# Patient Record
Sex: Female | Born: 1965 | Race: Black or African American | Hispanic: No | Smoking: Never smoker
Health system: Southern US, Community
[De-identification: ages and names within clinical notes are randomized; demographics above are authoritative.]

## PROBLEM LIST (undated history)

## (undated) ENCOUNTER — Emergency Department (HOSPITAL_COMMUNITY): Admission: EM | Discharge status: Against Medical Advice | Payer: PRIVATE HEALTH INSURANCE

## (undated) DIAGNOSIS — F319 Bipolar disorder, unspecified: Secondary | ICD-10-CM

## (undated) DIAGNOSIS — S72009A Fracture of unspecified part of neck of unspecified femur, initial encounter for closed fracture: Secondary | ICD-10-CM

## (undated) DIAGNOSIS — T7840XA Allergy, unspecified, initial encounter: Secondary | ICD-10-CM

## (undated) DIAGNOSIS — M1990 Unspecified osteoarthritis, unspecified site: Secondary | ICD-10-CM

## (undated) HISTORY — DX: Bipolar disorder, unspecified: F31.9

## (undated) HISTORY — DX: Allergy, unspecified, initial encounter: T78.40XA

## (undated) HISTORY — PX: KNEE ARTHROSCOPY: SUR90

## (undated) HISTORY — DX: Unspecified osteoarthritis, unspecified site: M19.90

## (undated) NOTE — *Deleted (*Deleted)
[facility]    CSN: [id number] Arrival date & time: 03/01/20  1618      History   Chief Complaint Chief Complaint  Patient presents with  . Finger Injury    HPI Rhonda [name] is a 65 y.o. female.   HPI  Past Medical History:  Diagnosis Date  . Allergy   . Arthritis   . Bipolar disorder (HCC)    Dr. Noelle Penner in [facility]  . Diabetes mellitus   . Hip fracture (HCC)    left    There are no problems to display for this patient.   Past Surgical History:  Procedure Laterality Date  . KNEE ARTHROSCOPY Right     OB History   No obstetric history on file.      Home Medications    Prior to Admission medications   Medication Sig Start Date End Date Taking? Authorizing Provider  aspirin 325 MG tablet Take 325 mg by mouth daily as needed for mild pain.    [provider]  cephALEXin (KEFLEX) 500 MG capsule Take 1 capsule (500 mg total) by mouth 3 (three) times daily. 03/18/17   Sherren Mocha, MD  metroNIDAZOLE (FLAGYL) 500 MG tablet Take 1 tablet (500 mg total) by mouth 2 (two) times daily. 03/16/17   Sherren Mocha, MD  Multiple Vitamins-Minerals (CENTRUM) tablet Take 1 tablet by mouth daily.      [provider]    Family History Family History  Problem Relation Age of Onset  . Cancer Mother   . Diabetes Mother   . Diabetes Father   . Hyperlipidemia Brother   . Hypertension Brother     Social History Social History   Tobacco Use  . Smoking status: Former Games developer  . Smokeless tobacco: Never Used  Vaping Use  . Vaping Use: Never used  Substance Use Topics  . Alcohol use: No  . Drug use: No     Allergies   Penicillins and Merthiolate [benzalkonium chloride]   Review of Systems Review of Systems   Physical Exam Triage Vital Signs ED Triage Vitals  Enc Vitals Group     BP 03/01/20 1750 (!) 151/87     Pulse Rate 03/01/20 1750 95     Resp 03/01/20 1750 17     Temp 03/01/20 1750 98.4 F (36.9 C)     Temp src --      SpO2  03/01/20 1750 100 %     Weight --      Height --      Head Circumference --      Peak Flow --      Pain Score 03/01/20 1747 8     Pain Loc --      Pain Edu? --      Excl. in GC? --    No data found.  Updated Vital Signs BP (!) 151/87 (BP Location: Left Arm)   Pulse 95   Temp 98.4 F (36.9 C)   Resp 17   SpO2 100%   Visual Acuity Right Eye Distance:   Left Eye Distance:   Bilateral Distance:    Right Eye Near:   Left Eye Near:    Bilateral Near:     Physical Exam   UC Treatments / Results  Labs (all labs ordered are listed, but only abnormal results are displayed) Labs Reviewed - No data to display  EKG   Radiology No results found.  Procedures Procedures (including critical care time)  Medications Ordered in  UC Medications - No data to display  Initial Impression / Assessment and Plan / UC Course  I have reviewed the triage vital signs and the nursing notes.  Pertinent labs & imaging results that were available during my care of the patient were reviewed by me and considered in my medical decision making (see chart for details).     *** Final Clinical Impressions(s) / UC Diagnoses   Final diagnoses:  None   Discharge Instructions   None    ED Prescriptions    None     PDMP not reviewed this encounter.

---

## 1998-06-02 ENCOUNTER — Inpatient Hospital Stay (HOSPITAL_COMMUNITY): Admission: AD | Admit: 1998-06-02 | Discharge: 1998-06-06 | Payer: Self-pay | Admitting: Family Medicine

## 1998-07-15 ENCOUNTER — Other Ambulatory Visit: Admission: RE | Admit: 1998-07-15 | Discharge: 1998-07-15 | Payer: Self-pay | Admitting: Obstetrics and Gynecology

## 1998-07-28 ENCOUNTER — Encounter: Admission: RE | Admit: 1998-07-28 | Discharge: 1998-10-26 | Payer: Self-pay | Admitting: Family Medicine

## 2002-05-08 ENCOUNTER — Encounter: Payer: Self-pay | Admitting: Otolaryngology

## 2002-05-08 ENCOUNTER — Encounter: Admission: RE | Admit: 2002-05-08 | Discharge: 2002-05-08 | Payer: Self-pay | Admitting: Otolaryngology

## 2002-05-13 ENCOUNTER — Other Ambulatory Visit: Admission: RE | Admit: 2002-05-13 | Discharge: 2002-05-13 | Payer: Self-pay | Admitting: Obstetrics and Gynecology

## 2002-05-22 ENCOUNTER — Encounter: Payer: Self-pay | Admitting: Obstetrics and Gynecology

## 2002-05-22 ENCOUNTER — Ambulatory Visit (HOSPITAL_COMMUNITY): Admission: RE | Admit: 2002-05-22 | Discharge: 2002-05-22 | Payer: Self-pay | Admitting: Obstetrics and Gynecology

## 2002-05-26 ENCOUNTER — Encounter: Payer: Self-pay | Admitting: Obstetrics and Gynecology

## 2002-05-26 ENCOUNTER — Encounter: Admission: RE | Admit: 2002-05-26 | Discharge: 2002-05-26 | Payer: Self-pay | Admitting: Obstetrics and Gynecology

## 2002-05-27 ENCOUNTER — Encounter: Admission: RE | Admit: 2002-05-27 | Discharge: 2002-05-27 | Payer: Self-pay | Admitting: Obstetrics and Gynecology

## 2002-05-27 ENCOUNTER — Encounter: Payer: Self-pay | Admitting: Obstetrics and Gynecology

## 2002-05-27 ENCOUNTER — Encounter (INDEPENDENT_AMBULATORY_CARE_PROVIDER_SITE_OTHER): Payer: Self-pay | Admitting: Specialist

## 2003-04-07 ENCOUNTER — Encounter: Admission: RE | Admit: 2003-04-07 | Discharge: 2003-04-07 | Payer: Self-pay | Admitting: Obstetrics and Gynecology

## 2004-04-03 ENCOUNTER — Inpatient Hospital Stay (HOSPITAL_COMMUNITY): Admission: EM | Admit: 2004-04-03 | Discharge: 2004-04-10 | Payer: Self-pay | Admitting: Emergency Medicine

## 2004-08-08 ENCOUNTER — Encounter: Admission: RE | Admit: 2004-08-08 | Discharge: 2004-08-08 | Payer: Self-pay | Admitting: Obstetrics and Gynecology

## 2005-09-10 ENCOUNTER — Encounter: Admission: RE | Admit: 2005-09-10 | Discharge: 2005-09-10 | Payer: Self-pay | Admitting: Obstetrics and Gynecology

## 2006-03-10 ENCOUNTER — Ambulatory Visit: Payer: Self-pay | Admitting: *Deleted

## 2006-03-11 ENCOUNTER — Inpatient Hospital Stay (HOSPITAL_COMMUNITY): Admission: EM | Admit: 2006-03-11 | Discharge: 2006-03-13 | Payer: Self-pay | Admitting: *Deleted

## 2006-04-18 IMAGING — CR DG CHEST 1V PORT
1 series · 1 of 1 positions shown · non-contrast
Comparison: none

CLINICAL DATA: 38 year old with TKA.  Central line placement. 
 PORTABLE CHEST:
 The patient had a right internal jugular central venous catheter with tip to the level of the superior vena cava. There is no evidence for post procedure pneumothorax. The lungs are clear.  Heart size is normal.

[view not recorded]
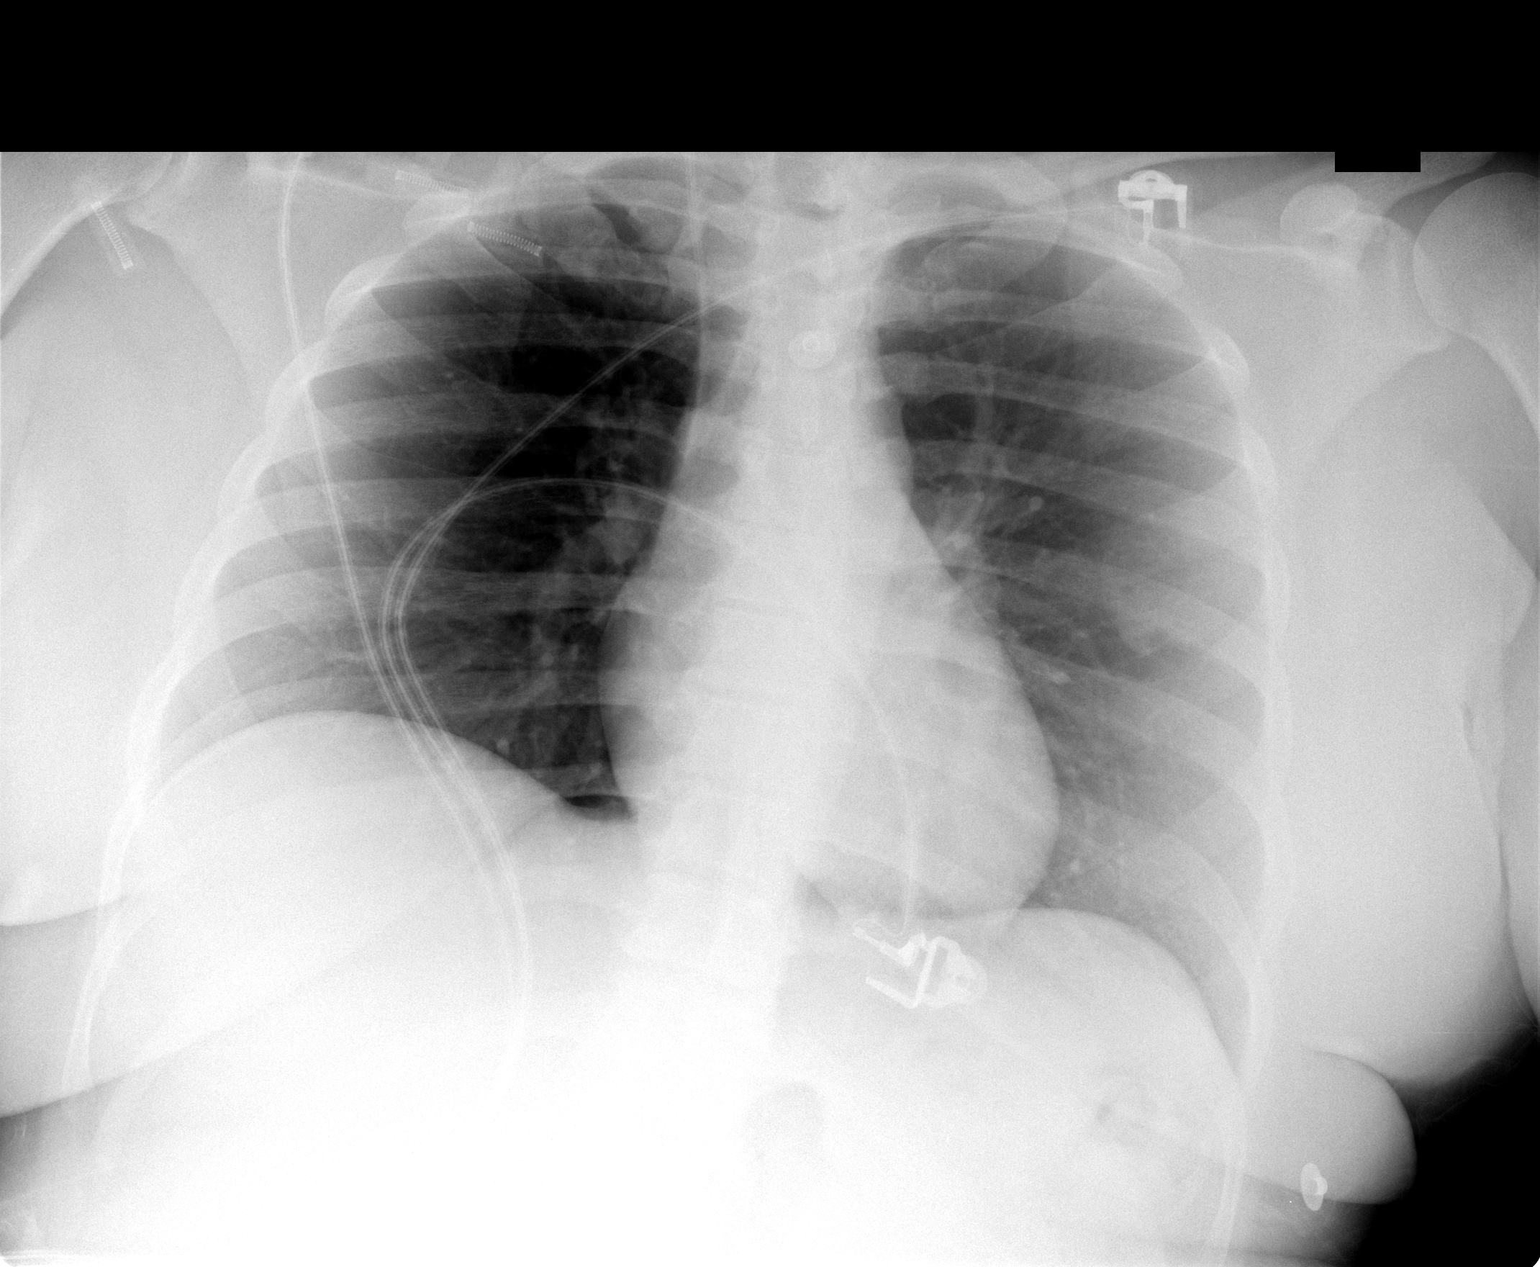

[1 of 1 positions shown; findings below may reference images not displayed]

IMPRESSION: Interval placement of right internal jugular central venous catheter without apparent complications.

## 2006-10-07 ENCOUNTER — Encounter: Admission: RE | Admit: 2006-10-07 | Discharge: 2006-10-07 | Payer: Self-pay | Admitting: Obstetrics and Gynecology

## 2007-10-09 ENCOUNTER — Encounter: Admission: RE | Admit: 2007-10-09 | Discharge: 2007-10-09 | Payer: Self-pay | Admitting: Obstetrics and Gynecology

## 2008-09-05 ENCOUNTER — Emergency Department (HOSPITAL_COMMUNITY): Admission: EM | Admit: 2008-09-05 | Discharge: 2008-09-06 | Payer: Self-pay | Admitting: *Deleted

## 2008-09-14 ENCOUNTER — Emergency Department (HOSPITAL_COMMUNITY): Admission: EM | Admit: 2008-09-14 | Discharge: 2008-09-14 | Payer: Self-pay | Admitting: Emergency Medicine

## 2008-09-15 ENCOUNTER — Emergency Department (HOSPITAL_COMMUNITY): Admission: EM | Admit: 2008-09-15 | Discharge: 2008-09-15 | Payer: Self-pay | Admitting: Emergency Medicine

## 2008-10-18 ENCOUNTER — Encounter: Admission: RE | Admit: 2008-10-18 | Discharge: 2008-10-18 | Payer: Self-pay | Admitting: Obstetrics and Gynecology

## 2010-08-01 ENCOUNTER — Other Ambulatory Visit: Payer: Self-pay | Admitting: Obstetrics and Gynecology

## 2010-08-01 DIAGNOSIS — Z1231 Encounter for screening mammogram for malignant neoplasm of breast: Secondary | ICD-10-CM

## 2010-08-09 ENCOUNTER — Ambulatory Visit
Admission: RE | Admit: 2010-08-09 | Discharge: 2010-08-09 | Disposition: A | Discharge status: Home / Self Care | Payer: PRIVATE HEALTH INSURANCE | Source: Ambulatory Visit | Attending: Obstetrics and Gynecology | Admitting: Obstetrics and Gynecology

## 2010-08-09 DIAGNOSIS — Z1231 Encounter for screening mammogram for malignant neoplasm of breast: Secondary | ICD-10-CM

## 2010-08-18 NOTE — Discharge Summary (Signed)
NAMEPERSIS, Rhonda [name] NO.:  [id number]   MEDICAL RECORD NO.:  [id number]          PATIENT TYPE:  IPS   LOCATION:  0402                          [facility]ACILITY[facility]   PHYSICIAN:  Jasmine Pang, M.D. DATE OF BIRTH:  05-22-1965   DATE OF ADMISSION:  03/11/2006  DATE OF DISCHARGE:  03/13/2006                               DISCHARGE SUMMARY   IDENTIFYING INFORMATION:  This is a 45 year old single African-American  female who was admitted on a voluntary basis on March 10, 2006.   HISTORY OF PRESENT ILLNESS:  Patient has a history of a petition against  her. The paper states that she has been endorsing homicidal ideation and  to hurt others.  She denies suicidal ideation.  She is off medications  for the past 2 months, Abilify, states it makes her speech slurred. She  is beginning to not feel well, having bad thoughts and went to see her  psychiatrist, Dr. Maryelizabeth Kaufmann. This is the first [facility]  admission for this patient. She sees Dr. Estelle June for outpatient  treatment.  She had a suicide attempt by overdose in 1996. In the past,  she has been on Risperdal, Depakote, Stelazine, Prolixin, and Zyprexa.  The patient's psychiatrist, Dr. Maryelizabeth Kaufmann, states she is only compliant for  several months at a time and then stops her medications.  Dr. Maryelizabeth Kaufmann  feels that once she gets back on her medications, she probably gets back  to baseline pretty quickly. The patient is on Abilify 10 mg p.o. nightly  but has been off for the past 2 months.  For further admission  information, please see psychiatric admission assessment.   The patient reported that there were angels around her who were talking  to her. She states she was able to communicate with them and found  herself talking out loud to them at work.  She states she feels that she  is [address], although she says there are times when she knows this  is not true.  She says at other times it is as if the angels could  talk  through her.  She says she has seen pictures on CD sleeves move and  talk for a while.  She states that she has found herself irritable and  a little paranoid about her mother. She had thoughts of making her  mother die by hitting her with a baseball bat but denies that she would  do this.   PHYSICAL EXAMINATION:  The patient was assessed at [facility]  There were no acute medical problems noted.  She has a history of  diabetes. CBC was within normal limits.  Glucose was 330.  Urine drug  screen positive. Urine pregnancy test negative.   HOSPITAL COURSE:  Upon admission, the patient was continued on her  outpatient medications of metformin 500 mg p.o. b.i.d. and Byetta 10 mg  subcutaneously b.i.d. and Abilify 10 mg p.o. nightly. On March 13, 2006, the patient's Byetta was discontinued.  She was placed on sliding  scale insulin.  She was started on Abilify 10 mg p.o. at 8 p.m.  and  Ambien 10 mg p.o. nightly p.r.n. insomnia.  She was started on Risperdal  0.5 mg p.o. q.6 h p.r.n.. agitation.  On March 12, 2006, the patient  was given 8 units of NPH subcutaneously at 1700 daily.  The patient  tolerated medications well.  The patient was placed on a glycemic  control protocol as per unit protocol, and the patient tolerated her  medications well with no significant side effects.Marland Kitchen   Upon admission, the patient stated she was from [facility] but was  seeing a psychiatrist in [place]. She stated she told her doctor she felt  irritated and had gotten off Abilify for the past 2 months.  She was  thinking of hurting her mother who gets on her nerves.  She stated she  has angels around her and that she was [address] according to [place].  She admitted saying this.  She admits to thinking of hitting people with  a bat because she is angry.  She had no specific person identified. On  March 12, 2006, the patient stated she talked with her mother  yesterday .  Her mother was  somewhat supportive.  She wants [place] to call  her doctor in [place]. I advised we were trying to reach her.  The  patient slept well.  Appetite was good.  She was agreeing to restart  Abilify 10 mg daily. In talking with Dr. Maryelizabeth Kaufmann, she felt that Rhonda [name]  gets to baseline easily and that as long as she was taking her  medications and not threatening to hurt herself or anyone else, that she  was probably capable of being discharged.   On March 13, 2006,  the patient's mental status had improved  markedly.  She was friendly and cooperative.  She had good eye contact.  Speech was normal rate and flow.  Psychomotor activity was within normal  limits.  Her mood was less depressed and irritable and anxious, affect  wider range.  There was no suicidal ideation.  No homicidal ideation.  No thoughts of self injurious behavior.  No auditory or visual  hallucinations.  No paranoia or delusions.  Thoughts were logical and  goal directed.  Thought content: No predominant theme. Cognitive was  grossly within normal limits.  It was felt the patient was safe to be  discharged today.  She will be transported back to [facility] to pick up her  car in the [facility] parking lot by the [facility]  Department.   DISCHARGE DIAGNOSES:  AXIS I: Mood disorder not otherwise specified  (rule out schizoaffective disorder).  AXIS II: None  AXIS III: Type 2 diabetes mellitus.  AXIS IV: Moderate (housing problem, medical problems other psychosocial  problem.  AXIS V: GAF upon upon discharge was 40.  GAF upon admission was 20.  GAF  highest past year was 60-65.   DISCHARGE PLANS:  There were no specific activity level or dietary  restrictions.   DISCHARGE MEDICATIONS:  1. Metformin 500 mg p.o. b.i.d. with food.  2. Abilify 10 mg at 8:00 p.m.   POST HOSPITAL CARE PLANS:  Dr. Maryelizabeth Kaufmann for followup treatment. She will  see her Saturday, April 06, 2006, at 9:30 a.m.      Jasmine Pang,  M.D. Electronically Signed     BHS/MEDQ  D:  03/13/2006  T:  03/13/2006  Job:  [id number]

## 2010-08-18 NOTE — Op Note (Signed)
[name], [name]            ACCOUNT NO.:  [id number]   MEDICAL RECORD NO.:  [id number]          PATIENT TYPE:  INP   LOCATION:  3306                         FACILITY:  [facility]   PHYSICIAN:  Sharlet Salina T. Hoxworth, M.D.DATE OF BIRTH:  10-24-1965   DATE OF PROCEDURE:  04/05/2003  DATE OF DISCHARGE:                                 OPERATIVE REPORT   PROCEDURE:  Placement of central venous catheter.   DESCRIPTION:  I was asked to obtain central venous access on this patient  due to a lack of peripheral access and admission with diabetic ketoacidosis.  The procedure was explained to the patient and family and informed consent  obtained.  The right neck was then sterilely prepped and draped.  The  anterior neck was anesthetized with local anesthesia and then the right  internal jugular vein cannulated with a needle and guide-wire without  difficulty.  The dilator was passed over the guide-wire and then the triple  lumen catheter passed over the guide-wire which was removed intact and the  catheter positioned at about 16 cm from the skin.  It aspirated easily and  was flushed with saline.  It was sutured into place with interrupted silks.  Chest x-ray pending.      Benj   BTH/MEDQ  D:  04/04/2004  T:  04/04/2004  Job:  [id number]

## 2010-08-18 NOTE — Discharge Summary (Signed)
Rhonda [name], KREITZ NO.:  [id number]   MEDICAL RECORD NO.:  [id number]          PATIENT TYPE:  INP   LOCATION:  6711                         FACILITY:  [facility]   PHYSICIAN:  Lorelle Formosa, M.D.DATE OF BIRTH:  [birth year]   DATE OF ADMISSION:  04/03/2004  DATE OF DISCHARGE:  04/10/2004                                 DISCHARGE SUMMARY   ADMITTING DIAGNOSES:  1.  Diabetic ketoacidosis.  2.  Bipolar disorder.  3.  History of hypertension.   DISCHARGE DIAGNOSES:  1.  Insulin-dependent diabetes mellitus with diabetic ketoacidosis.  2.  Obesity.  3.  Bipolar disorder.  4.  Headaches.   CONDITION ON DISCHARGE:  Stable.   DISCHARGE DISPOSITION:  Follow up in the office in approximately two weeks.   DISCHARGE MEDICATIONS:  Lantus insulin 50 units b.i.d.   HISTORY:  This patient is a 45 year old separated black woman who is a  resident of the city and presented with ketoacidosis of diabetes.  Patient  apparently called 911 day of admission ___________ back pain.  __________  apparently suggested by a family member.  Patient apparently had some  ___________ December.  Stated she was quite thirsty and drank mostly  carbonated beverages and tea.  Last fluid intake was about two days prior.  She admitted having some significant nausea/vomiting.  Patient denied use of  any other medicines, although her history suggested she has bipolar  disorder.   PERSONAL HISTORY:  __________.   REVIEW OF SYSTEMS:  On admission history and physical.   FAMILY HISTORY:  On admission history and physical.   PAST MEDICAL HISTORY:  On admission history and physical.   PHYSICAL EXAMINATION:  VITAL SIGNS:  Blood pressure 112/76, pulse 140,  respirations 26, pulse ox 100% on room air.  She was afebrile with  temperature 97.1 rectally.  GENERAL:  Patient was alert and oriented when stimulated.  She was lethargic  ___________ moderate sensorium change when not stimulated.  Patient  had  symmetrical movements of her upper extremities and lower extremities.  HEENT:  PERRLA.  EOMs were intact.  Mouth examination revealed moist mucous  membranes.  NECK:  Supple.  No jugular venous distention, bruits, __________  thyromegaly.  CHEST:  Clear to auscultation.  BREASTS:  Normal.  HEART:  Regular rhythm and rate with mild tachycardia.  No murmur.  ABDOMEN:  Soft, flat.  Bowel sounds were active.  EXTREMITIES:  No edema.  SKIN:  Slightly dry.  NEUROLOGIC:  __________.   LABORATORIES:  Arterial blood gas revealed pH of 7.071, pCO2 of 22.1, and  bicarbonate 7.6 with base deficit of 21.  Last gas was pH 7.26 with pCO2 of  25, pO2 of 104, base deficit of 14.6 with bicarbonate of 13.8.  CBC revealed  white count of 15.9 with hemoglobin __________ with reduction on repeat to  15.6 and hematocrit of 59.0 with reduction to 48.0.  By discharge white  count was 7.3 with hemoglobin of 12.3 and hematocrit 36.2.  Initial  chemistries reveal sodium 151, potassium 5.0, chloride 132, glucose  __________, BUN 23, creatinine 1.6.  At discharge sodium was  136, potassium  3.9, chloride 102, CO2 29, glucose 237, BUN 4, creatinine 0.9.  Initial  urinalysis revealed yellow clear urine with specific gravity 1.037, greater  than ___________ mg/percent glucose, moderate hemoglobin, small bilirubin,  greater than 80 mg percent ketones, many epithelial cells and hyalin casts.  Portable chest x-ray revealed interval replacement of right internal jugular  central venous catheter without apparent complications.  EKG had sinus  tachycardia with right atrial enlargement.   HOSPITAL COURSE:  Patient was given monitoring in the stepdown area and  gradually improved.  She was given Glucommander support and recovered quite  well from ketoacidosis.  She was thus changed to a sliding scale of a.c. and  h.s. insulin Humalog.  She was transitioned to Lantus with resumption of  Abilify 10 mg daily and Actos 30  mg daily was added.  Patient had the Lantus  adjusted to 60 units subcutaneous h.s. and adjustment prior to hospital  discharge and was discharged on b.i.d. injections of 40 units subcutaneous.      WWM/MEDQ  D:  07/20/2004  T:  07/20/2004  Job:  [id number]

## 2010-09-18 ENCOUNTER — Ambulatory Visit: Payer: PRIVATE HEALTH INSURANCE | Admitting: Endocrinology

## 2010-09-18 DIAGNOSIS — Z0289 Encounter for other administrative examinations: Secondary | ICD-10-CM

## 2010-09-20 NOTE — Progress Notes (Unsigned)
  Subjective:    Patient ID: Rhonda [name], female    DOB: Jun 20, 1965, 45 y.o.   MRN: [id number]  HPI    Review of Systems     Objective:   Physical Exam        Assessment & Plan:

## 2011-05-25 ENCOUNTER — Encounter (HOSPITAL_COMMUNITY): Payer: Self-pay

## 2011-05-25 ENCOUNTER — Emergency Department (HOSPITAL_COMMUNITY)
Admission: EM | Admit: 2011-05-25 | Discharge: 2011-05-25 | Disposition: A | Discharge status: Home / Self Care | Payer: Self-pay | Attending: Emergency Medicine | Admitting: Emergency Medicine

## 2011-05-25 DIAGNOSIS — R21 Rash and other nonspecific skin eruption: Secondary | ICD-10-CM | POA: Insufficient documentation

## 2011-05-25 DIAGNOSIS — F319 Bipolar disorder, unspecified: Secondary | ICD-10-CM | POA: Insufficient documentation

## 2011-05-25 DIAGNOSIS — B354 Tinea corporis: Secondary | ICD-10-CM | POA: Insufficient documentation

## 2011-05-25 DIAGNOSIS — L2989 Other pruritus: Secondary | ICD-10-CM | POA: Insufficient documentation

## 2011-05-25 DIAGNOSIS — E119 Type 2 diabetes mellitus without complications: Secondary | ICD-10-CM | POA: Insufficient documentation

## 2011-05-25 DIAGNOSIS — L298 Other pruritus: Secondary | ICD-10-CM | POA: Insufficient documentation

## 2011-05-25 MED ORDER — DEXAMETHASONE SODIUM PHOSPHATE 4 MG/ML IJ SOLN
4.0000 mg | Freq: Once | INTRAMUSCULAR | Status: AC
  Administered 2011-05-25: 4 mg via INTRAMUSCULAR
  Filled 2011-05-25: qty 1

## 2011-05-25 MED ORDER — CLOTRIMAZOLE 1 % EX CREA: TOPICAL_CREAM | Freq: Two times a day (BID) | CUTANEOUS | Status: DC

## 2011-05-25 NOTE — ED Provider Notes (Signed)
History     CSN: [id number]  Arrival date & time 05/25/11  1622   First MD Initiated Contact with Patient 05/25/11 1808      Chief Complaint  Patient presents with  . Rash    (Consider location/radiation/quality/duration/timing/severity/associated sxs/prior treatment) Patient is a 46 y.o. female presenting with rash. The history is provided by the patient.  Rash    patient is a rash that itches on her bilateral inner thighs chest and some on her back. She states she's had it before. She states she was given a shot of something and felt better. She states she has used a new soap. No fevers.  Past Medical History  Diagnosis Date  . Diabetes mellitus   . Allergy   . Bipolar disorder     Dr. Noelle Penner in [facility]    History reviewed. No pertinent past surgical history.  History reviewed. No pertinent family history.  History  Substance Use Topics  . Smoking status: Not on file  . Smokeless tobacco: Not on file  . Alcohol Use: Yes    OB History    Grav Para Term Preterm Abortions TAB SAB Ect Mult Living                  Review of Systems  Gastrointestinal: Negative for abdominal pain.  Musculoskeletal: Negative for back pain.  Skin: Positive for rash. Negative for pallor and wound.  Hematological: Negative for adenopathy.  Psychiatric/Behavioral: Negative for confusion.    Allergies  Penicillins and Merthiolate  Home Medications   Current Outpatient Rx  Name Route Sig Dispense Refill  . ARIPIPRAZOLE 10 MG PO TABS Oral Take 10 mg by mouth daily.      Marland Kitchen METFORMIN HCL 1000 MG PO TABS Oral Take 1,000 mg by mouth 2 (two) times daily.      . CENTRUM PO TABS Oral Take 1 tablet by mouth daily.      Marland Kitchen CLOTRIMAZOLE 1 % EX CREA Topical Apply topically 2 (two) times daily. Use for 3 weeks 30 g 0    BP 148/103  Pulse 99  Temp(Src) 98.1 F (36.7 C) (Oral)  Resp 16  SpO2 100%  LMP 05/06/2011  Physical Exam  Constitutional: She appears well-developed.  HENT:  Head:  Normocephalic.  Abdominal: There is no tenderness.  Skin: Skin is warm. Rash noted.       Patient has pruritic plaques to bilateral inner thighs. She also has some in the fold between her breasts. She also something her upper gluteal fold. She also has some small areas on her antecubital areas. No apparent bacterial infection    ED Course  Procedures (including critical care time)  Labs Reviewed - No data to display No results found.   1. Tinea corporis       MDM  Patient has what appears to be a tinea corporis. She's not appear to have a secondary infection. She'll be given some steroids to help with the itching, she'll also be given some antifungal.. she will followup with her primary care, and possibly dermatology        Juliet Rude. Rubin Payor, MD 05/25/11 [phone]

## 2011-05-25 NOTE — ED Notes (Signed)
Pt c/o hives on bilateral legs and chest area, hx of the same in 2009 and received a hydrocortisone shot relieving her symptoms, pt reports recent change in soap

## 2011-05-25 NOTE — Discharge Instructions (Signed)
Fungus Infection of the Skin °An infection of your skin caused by a fungus is a very common problem. Treatment depends on which part of the body is affected. Types of fungal skin infection include: °· Athlete's Foot(Tinea pedis). This infection starts between the toes and may involve the entire sole and sides of foot. It is the most common fungal disease. It is made worse by heat, moisture, and friction. To treat, wash your feet 2 to 3 times daily. Dry thoroughly between the toes. Use medicated foot powder or cream as directed on the package. Plain talc, cornstarch, or rice powder may be dusted into socks and shoes to keep the feet dry. Wearing footwear that allows ventilation is also helpful.  °· Ringworm (Tinea corporis and tinea capitis). This infection causes scaly red rings to form on the skin or scalp. For skin sores, apply medicated lotion or cream as directed on the package. For the scalp, medicated shampoo may be used with with other therapies. Ringworm of the scalp or fingernails usually requires using oral medicine for 2 to 4 months.  °· Tinea versicolor. This infection appears as painless, scaly, patchy areas of discolored skin (whitish to light brown). It is more common in the summer and favors oily areas of the skin such as those found at the chest, abdomen, back, pubis, neck, and body folds. It can be treated with medicated shampoo or with medicated topical cream. Oral antifungals may be needed for more active infections. The light and/or dark spots may take time to get better and is not a sign of treatment failure.  °Fungal infections may need to be treated for several weeks to be cured. It is important not to treat fungal infections with steroids or combination medicine that contains an antifungal and steroid as these will make the fungal infection worse. °SEEK MEDICAL CARE IF:  °· You have persistent itching or rawness.  °· You have an oral temperature above 102° F (38.9° C).  °Document Released:  04/26/2004 Document Revised: 11/29/2010 Document Reviewed: 07/12/2009 °[employer]® Patient Information ©2012 [employer]. °

## 2011-06-11 ENCOUNTER — Emergency Department (HOSPITAL_COMMUNITY)
Admission: EM | Admit: 2011-06-11 | Discharge: 2011-06-11 | Disposition: A | Discharge status: Home / Self Care | Payer: Self-pay | Attending: Emergency Medicine | Admitting: Emergency Medicine

## 2011-06-11 ENCOUNTER — Encounter (HOSPITAL_COMMUNITY): Payer: Self-pay | Admitting: Emergency Medicine

## 2011-06-11 DIAGNOSIS — L259 Unspecified contact dermatitis, unspecified cause: Secondary | ICD-10-CM | POA: Insufficient documentation

## 2011-06-11 DIAGNOSIS — F17200 Nicotine dependence, unspecified, uncomplicated: Secondary | ICD-10-CM | POA: Insufficient documentation

## 2011-06-11 DIAGNOSIS — Z79899 Other long term (current) drug therapy: Secondary | ICD-10-CM | POA: Insufficient documentation

## 2011-06-11 DIAGNOSIS — L309 Dermatitis, unspecified: Secondary | ICD-10-CM

## 2011-06-11 DIAGNOSIS — E119 Type 2 diabetes mellitus without complications: Secondary | ICD-10-CM | POA: Insufficient documentation

## 2011-06-11 DIAGNOSIS — R21 Rash and other nonspecific skin eruption: Secondary | ICD-10-CM | POA: Insufficient documentation

## 2011-06-11 DIAGNOSIS — F319 Bipolar disorder, unspecified: Secondary | ICD-10-CM | POA: Insufficient documentation

## 2011-06-11 MED ORDER — HYDROCORTISONE 2.5 % EX LOTN: TOPICAL_LOTION | Freq: Two times a day (BID) | CUTANEOUS | Status: AC

## 2011-06-11 MED ORDER — DIPHENHYDRAMINE HCL 25 MG PO CAPS
50.0000 mg | ORAL_CAPSULE | Freq: Once | ORAL | Status: AC
  Administered 2011-06-11: 50 mg via ORAL
  Filled 2011-06-11: qty 2

## 2011-06-11 NOTE — ED Notes (Signed)
Rx x 1, pt voiced understanding to f/u with dermatologist.

## 2011-06-11 NOTE — ED Notes (Signed)
PT. REPORTS PERSISTENT ITCHY RASHES AT GROIN / LEGS /ARMS AND BREAST FOR SEVERAL DAYS , STATES DIAGNOSED WITH FUNGAL SKIN INFECTION - RECEIVED "STEROID SHOT" WITH NO IMPROVEMENT.

## 2011-06-11 NOTE — ED Provider Notes (Signed)
History     CSN: [id number]  Arrival date & time 06/11/11  7829   First MD Initiated Contact with Patient 06/11/11 0408      Chief Complaint  Patient presents with  . Rash     Patient is a 46 y.o. female presenting with rash. The history is provided by the patient.  Rash    the patient reports a rash between her thighs under her bilateral breasts and involving her bilateral antecubital space as does his been present for approximately 3-1/2 weeks.  She is intermittently had a rash like this before.  She denies a history of asthma next month.  She denies recent changes in soaps or detergents.  She's had no fevers or chills.  She denies spreading erythema.  Nothing worsens her symptoms.  Nothing improves her symptoms.  Her symptoms are constant.  She was recently seen in the ER and started on Lotrimin cream without improvement.  She reports receiving a "steroid shot" with transient improvement in her symptoms.   Past Medical History  Diagnosis Date  . Diabetes mellitus   . Allergy   . Bipolar disorder     Dr. Noelle Penner in [facility]    History reviewed. No pertinent past surgical history.  No family history on file.  History  Substance Use Topics  . Smoking status: Current Everyday Smoker  . Smokeless tobacco: Not on file  . Alcohol Use: Yes    OB History    Grav Para Term Preterm Abortions TAB SAB Ect Mult Living                  Review of Systems  Skin: Positive for rash.  All other systems reviewed and are negative.    Allergies  Penicillins and Merthiolate  Home Medications   Current Outpatient Rx  Name Route Sig Dispense Refill  . ARIPIPRAZOLE 10 MG PO TABS Oral Take 10 mg by mouth daily.      Marland Kitchen CLOTRIMAZOLE 1 % EX CREA Topical Apply 1 application topically 2 (two) times daily. Use for 3 weeks    . METFORMIN HCL 1000 MG PO TABS Oral Take 1,000 mg by mouth 2 (two) times daily.      . CENTRUM PO TABS Oral Take 1 tablet by mouth daily.      Marland Kitchen HYDROCORTISONE 2.5 %  EX LOTN Topical Apply topically 2 (two) times daily. 59 mL 0    BP 146/85  Pulse 84  Temp(Src) 97.6 F (36.4 C) (Oral)  Resp 18  SpO2 100%  LMP 05/06/2011  Physical Exam  Constitutional: She is oriented to person, place, and time. She appears well-developed and well-nourished.  HENT:  Head: Normocephalic.  Eyes: EOM are normal.  Neck: Normal range of motion.  Pulmonary/Chest: Effort normal.  Musculoskeletal: Normal range of motion.  Neurological: She is alert and oriented to person, place, and time.  Skin: No erythema.       Excoriated rash that is raised and macular papular and flexural areas involving bilateral thighs bilateral antecubital spaces and under her bilateral breasts and between her breasts overlying her sternum where her breast tissue is together.  There is no warmth drainage pus or spreading erythema  Psychiatric: She has a normal mood and affect.    ED Course  Procedures (including critical care time)  Labs Reviewed - No data to display No results found.   1. Eczema       MDM  Her rash appears to represent eczema.  I will  treat this with a hydrocortisone cream.  She's been told to followup with the dermatologist.  There is a secondary signs of infection at this time        Lyanne Co, MD 06/11/11 0501

## 2011-06-11 NOTE — ED Notes (Signed)
Pt with rash for past 2 weeks.  Was seen by MD and told it was a fungal infection.  Given steroids and other medication.  States itching is improving, but is still there.  Also, rash burns after showering and "cracks".  Rash is located in inner thighs, bilateral elbow area, chest, and under breasts.  NAD, alert and oriented x 4.

## 2011-06-11 NOTE — Discharge Instructions (Signed)

## 2012-09-18 ENCOUNTER — Emergency Department (HOSPITAL_COMMUNITY)
Admission: EM | Admit: 2012-09-18 | Discharge: 2012-09-18 | Disposition: A | Discharge status: Home / Self Care | Payer: Self-pay | Attending: Emergency Medicine | Admitting: Emergency Medicine

## 2012-09-18 ENCOUNTER — Encounter (HOSPITAL_COMMUNITY): Payer: Self-pay

## 2012-09-18 DIAGNOSIS — H6690 Otitis media, unspecified, unspecified ear: Secondary | ICD-10-CM | POA: Insufficient documentation

## 2012-09-18 DIAGNOSIS — Z79899 Other long term (current) drug therapy: Secondary | ICD-10-CM | POA: Insufficient documentation

## 2012-09-18 DIAGNOSIS — F17200 Nicotine dependence, unspecified, uncomplicated: Secondary | ICD-10-CM | POA: Insufficient documentation

## 2012-09-18 DIAGNOSIS — J3489 Other specified disorders of nose and nasal sinuses: Secondary | ICD-10-CM | POA: Insufficient documentation

## 2012-09-18 DIAGNOSIS — E119 Type 2 diabetes mellitus without complications: Secondary | ICD-10-CM | POA: Insufficient documentation

## 2012-09-18 DIAGNOSIS — Z880 Allergy status to penicillin: Secondary | ICD-10-CM | POA: Insufficient documentation

## 2012-09-18 DIAGNOSIS — F319 Bipolar disorder, unspecified: Secondary | ICD-10-CM | POA: Insufficient documentation

## 2012-09-18 DIAGNOSIS — H6692 Otitis media, unspecified, left ear: Secondary | ICD-10-CM

## 2012-09-18 DIAGNOSIS — Z8669 Personal history of other diseases of the nervous system and sense organs: Secondary | ICD-10-CM | POA: Insufficient documentation

## 2012-09-18 HISTORY — DX: Fracture of unspecified part of neck of unspecified femur, initial encounter for closed fracture: S72.009A

## 2012-09-18 MED ORDER — CEFDINIR 300 MG PO CAPS: 300.0000 mg | ORAL_CAPSULE | Freq: Two times a day (BID) | ORAL | Status: DC

## 2012-09-18 NOTE — ED Provider Notes (Signed)
Medical screening examination/treatment/procedure(s) were performed by non-physician practitioner and as supervising physician I was immediately available for consultation/collaboration.  Flint Melter, MD 09/18/12 [phone]

## 2012-09-18 NOTE — ED Provider Notes (Signed)
History    This chart was scribed for non-physician practitioner working with Flint Melter, MD by Leone Payor, ED Scribe. This patient was seen in room TR06C/TR06C and the patient's care was started at 1457.   CSN: [id number]  Arrival date & time 09/18/12  1457   First MD Initiated Contact with Patient 09/18/12 1504      Chief Complaint  Patient presents with  . Otalgia     The history is provided by the patient. No language interpreter was used.    HPI Comments: Rhonda [name] is a 47 y.o. female who presents to the Emergency Department complaining of constant, unchanged L sided otalgia that started about 4-5 ago. She states she does not have any pain but she complains of stuffiness and muffled hearing through her L ear. Pt states she had sinus congestion and drainage that started about 1.5 weeks ago and has been taking OTC medications with no relief. She is still taking decongestants and benadryl with no relief. States she has h/o swimmer's ear. She denies sinus pressure, cough, SOB, chest pain, abdominal pain, nausea, vomiting, diarrhea, fever, chills, sweats.    Past Medical History  Diagnosis Date  . Diabetes mellitus   . Allergy   . Bipolar disorder     Dr. Noelle Penner in [facility]  . Hip fracture     left    Past Surgical History  Procedure Laterality Date  . Knee arthroscopy Right     History reviewed. No pertinent family history.  History  Substance Use Topics  . Smoking status: Current Every Day Smoker  . Smokeless tobacco: Not on file  . Alcohol Use: Yes     Comment: rarely    OB History   Grav Para Term Preterm Abortions TAB SAB Ect Mult Living                  Review of Systems  Constitutional: Negative for fever and chills.  HENT: Positive for ear pain.   Respiratory: Negative for cough and shortness of breath.   Cardiovascular: Negative for chest pain.  Gastrointestinal: Negative for nausea, vomiting, abdominal pain and diarrhea.    Allergies   Penicillins and Merthiolate  Home Medications   Current Outpatient Rx  Name  Route  Sig  Dispense  Refill  . ARIPiprazole (ABILIFY) 10 MG tablet   Oral   Take 10 mg by mouth daily.           . metFORMIN (GLUCOPHAGE) 1000 MG tablet   Oral   Take 1,000 mg by mouth 2 (two) times daily.           . Multiple Vitamins-Minerals (CENTRUM) tablet   Oral   Take 1 tablet by mouth daily.             BP 133/73  Pulse 96  Temp(Src) 98.3 F (36.8 C) (Oral)  Resp 20  Ht 5\' 6"  (1.676 m)  Wt 180 lb (81.647 kg)  BMI 29.07 kg/m2  SpO2 100%  LMP 08/21/2012  Physical Exam  Nursing note and vitals reviewed. Constitutional: She appears well-developed and well-nourished. No distress.  HENT:  Head: Normocephalic and atraumatic.  Right Ear: Tympanic membrane and ear canal normal.  Left Ear: Ear canal normal. There is swelling. No drainage or tenderness. No foreign bodies. Tympanic membrane is bulging.  Mouth/Throat: Oropharynx is clear and moist. No oropharyngeal exudate.  Left TM bulging and white, surrounding erythema.  Canal is normal.    Eyes: Conjunctivae are normal. Right  eye exhibits no discharge. Left eye exhibits no discharge.  Neck: Neck supple.  Cardiovascular: Normal rate and regular rhythm.   Pulmonary/Chest: Effort normal and breath sounds normal. No stridor. No respiratory distress. She has no wheezes. She has no rales.  Lymphadenopathy:    She has no cervical adenopathy.  Neurological: She is alert.  Skin: She is not diaphoretic.    ED Course  Procedures (including critical care time)  DIAGNOSTIC STUDIES: Oxygen Saturation is 100% on room air, normal by my interpretation.    COORDINATION OF CARE: 3:27 PM Discussed treatment plan with pt at bedside and pt agreed to plan.    Labs Reviewed - No data to display No results found.   1. Otitis media, left       MDM  Pt with isolated left otitis media, not in the setting of viral infection.  Pt is afebrile,  nontoxic.  Nontender.  Doubt mastoiditis.  Doubt malignant otitis.  Pt d/c home with abx, PCP follow up.  Discussed findings, treatment, follow up with patient.  Pt given return precautions.  Pt verbalizes understanding and agrees with plan.       I personally performed the services described in this documentation, which was scribed in my presence. The recorded information has been reviewed and is accurate.   Trixie Dredge, PA-C 09/18/12 1551

## 2012-09-18 NOTE — ED Notes (Signed)
Pt states about a week ago she started having sinus congestion and drainage and had been taking OTC meds which haven't helped. Pt states now it has moved to her left ear.

## 2012-09-19 ENCOUNTER — Telehealth (HOSPITAL_COMMUNITY): Payer: Self-pay | Admitting: Emergency Medicine

## 2012-09-19 NOTE — Progress Notes (Signed)
   CARE MANAGEMENT ED NOTE 09/19/2012  Patient:  Rhonda [name], Rhonda [name]   Account Number:  [id number]  Date Initiated:  09/18/2012  Documentation initiated by:  Fransico Michael  Subjective/Objective Assessment:   presented to ED with c/o otalgia     Subjective/Objective Assessment Detail:     Action/Plan:   Action/Plan Detail:   Anticipated DC Date:  09/18/2012     Status Recommendation to Physician:   Result of Recommendation:      DC Planning Services  CM consult  MATCH Program    Choice offered to / List presented to:            Status of service:  Completed, signed off  ED Comments:   ED Comments Detail:  09/19/12-0947-J.Rainah Kirshner,RN,BSN [phone]      Out to waitning room to speak with patient. Explained MATCH program and guidelines to patient. Voiced understanding. Reiterated the $3 copay and elligibility only once per rolling calendar year.  Patient voiced understanding. MATCH letter given to patient. No further needs identified.  09/19/12-0927-J.Katrese Shell,RN,BSN [phone]     Assessed for MATCH. Pt is elligible and was enrolled by ED CM. Phoned [facility] on [address] at [phone] but was told that patient had left to return to [facility] ED. Will await patient's arrival.  09/19/12-0918-J.Scarlette Hogston,RN,BSN [phone]      Recieved referral from Jetty Duhamel Manager regarding patient needing assistance with medications. Patient discharged from ED with prescription for [facility] for otalgia. Non-insured and patient can not afford medication. Will assess for elligibility for [facility] program.

## 2012-09-19 NOTE — ED Notes (Signed)
[facility] pharm called . pts antibiotic too expensive and she has no insurance. Information turned over to case management PJ.  She will follow-up.

## 2012-09-28 ENCOUNTER — Emergency Department (HOSPITAL_COMMUNITY)
Admission: EM | Admit: 2012-09-28 | Discharge: 2012-09-28 | Disposition: A | Discharge status: Home / Self Care | Payer: Self-pay | Attending: Emergency Medicine | Admitting: Emergency Medicine

## 2012-09-28 ENCOUNTER — Encounter (HOSPITAL_COMMUNITY): Payer: Self-pay | Admitting: Emergency Medicine

## 2012-09-28 DIAGNOSIS — H9202 Otalgia, left ear: Secondary | ICD-10-CM

## 2012-09-28 DIAGNOSIS — F17200 Nicotine dependence, unspecified, uncomplicated: Secondary | ICD-10-CM | POA: Insufficient documentation

## 2012-09-28 DIAGNOSIS — E119 Type 2 diabetes mellitus without complications: Secondary | ICD-10-CM | POA: Insufficient documentation

## 2012-09-28 DIAGNOSIS — Z79899 Other long term (current) drug therapy: Secondary | ICD-10-CM | POA: Insufficient documentation

## 2012-09-28 DIAGNOSIS — Z880 Allergy status to penicillin: Secondary | ICD-10-CM | POA: Insufficient documentation

## 2012-09-28 DIAGNOSIS — H9209 Otalgia, unspecified ear: Secondary | ICD-10-CM | POA: Insufficient documentation

## 2012-09-28 DIAGNOSIS — F319 Bipolar disorder, unspecified: Secondary | ICD-10-CM | POA: Insufficient documentation

## 2012-09-28 MED ORDER — ANTIPYRINE-BENZOCAINE 5.4-1.4 % OT SOLN: 3.0000 [drp] | OTIC | Status: DC | PRN

## 2012-09-28 MED ORDER — CIPROFLOXACIN HCL 250 MG PO TABS: 250.0000 mg | ORAL_TABLET | Freq: Two times a day (BID) | ORAL | Status: DC

## 2012-09-28 NOTE — ED Notes (Signed)
Pt reports here for refill of Omnicef. Pt had prescription for left ear infection. Pt reports that her left ear remains clogged. Pt thinks if she could get another week of the antibiotic or some ear drops that may help.

## 2012-09-28 NOTE — ED Provider Notes (Signed)
History    CSN: [id number] Arrival date & time 09/28/12  1216  First MD Initiated Contact with Patient 09/28/12 1233     Chief Complaint  Patient presents with  . Medication Refill   (Consider location/radiation/quality/duration/timing/severity/associated sxs/prior Treatment) HPI  Pt with otalgia and a penicillin allergy presents to the ED with complaints of still having mild left ear pain. She was seen  2 weeks ago, treated with Omnicef with our match Rx program, finished the Rx this past Thursday, went swimming on Friday and is having ear pains again. She wants to use the Match program again to get the rest of the pills. She is having some mild pain. Right ear no longer hurts. No sore throat, fevers, neck pain, nausea, vomiting, diarrhea.   Past Medical History  Diagnosis Date  . Diabetes mellitus   . Allergy   . Bipolar disorder     Dr. Noelle Penner in [facility]  . Hip fracture     left   Past Surgical History  Procedure Laterality Date  . Knee arthroscopy Right    No family history on file. History  Substance Use Topics  . Smoking status: Current Every Day Smoker  . Smokeless tobacco: Not on file  . Alcohol Use: Yes     Comment: rarely   OB History   Grav Para Term Preterm Abortions TAB SAB Ect Mult Living                 Review of Systems  HENT: Positive for ear pain.   All other systems reviewed and are negative.    Allergies  Penicillins and Merthiolate  Home Medications   Current Outpatient Rx  Name  Route  Sig  Dispense  Refill  . Multiple Vitamins-Minerals (CENTRUM) tablet   Oral   Take 1 tablet by mouth daily.           Marland Kitchen antipyrine-benzocaine (AURALGAN) otic solution   Left Ear   Place 3 drops into the left ear every 2 (two) hours as needed for pain.   15 mL   0   . ciprofloxacin (CIPRO) 250 MG tablet   Oral   Take 1 tablet (250 mg total) by mouth 2 (two) times daily.   10 tablet   0    BP 148/86  Pulse 83  Temp(Src) 97.8 F (36.6 C)  (Oral)  Resp 18  SpO2 99%  LMP 09/22/2012 Physical Exam  Nursing note and vitals reviewed. Constitutional: She appears well-developed and well-nourished. No distress.  HENT:  Head: Normocephalic and atraumatic.  Right Ear: Tympanic membrane and ear canal normal.  Left Ear: Tympanic membrane is injected.  Eyes: Pupils are equal, round, and reactive to light.  Neck: Normal range of motion. Neck supple.  Cardiovascular: Normal rate and regular rhythm.   Pulmonary/Chest: Effort normal.  Abdominal: Soft.  Neurological: She is alert.  Skin: Skin is warm and dry.    ED Course  Procedures (including critical care time) Labs Reviewed - No data to display No results found. 1. Otalgia of left ear     MDM  Pt had otits media which started to resolve but went swimming again and has another left sided ear infection. Omnicef too expensive as self pay. Will Try PO cipro.  47 y.o.Rhonda [name]'s evaluation in the Emergency Department is complete. It has been determined that no acute conditions requiring further emergency intervention are present at this time. The patient/guardian have been advised of the diagnosis and plan. We have  discussed signs and symptoms that warrant return to the ED, such as changes or worsening in symptoms.  Vital signs are stable at discharge. Filed Vitals:   09/28/12 1218  BP: 148/86  Pulse: 83  Temp: 97.8 F (36.6 C)  Resp: 18    Patient/guardian has voiced understanding and agreed to follow-up with the PCP or specialist.   Dorthula Matas, PA-C 09/28/12 1316

## 2012-09-30 NOTE — ED Provider Notes (Signed)
Medical screening examination/treatment/procedure(s) were performed by non-physician practitioner and as supervising physician I was immediately available for consultation/collaboration.   Orval Dortch M Donalee Gaumond, DO 09/30/12 1259 

## 2012-10-07 ENCOUNTER — Encounter (HOSPITAL_COMMUNITY): Payer: Self-pay | Admitting: Nurse Practitioner

## 2012-10-07 ENCOUNTER — Emergency Department (HOSPITAL_COMMUNITY)
Admission: EM | Admit: 2012-10-07 | Discharge: 2012-10-07 | Disposition: A | Discharge status: Home / Self Care | Payer: Self-pay | Attending: Emergency Medicine | Admitting: Emergency Medicine

## 2012-10-07 DIAGNOSIS — H6982 Other specified disorders of Eustachian tube, left ear: Secondary | ICD-10-CM

## 2012-10-07 DIAGNOSIS — Z87891 Personal history of nicotine dependence: Secondary | ICD-10-CM | POA: Insufficient documentation

## 2012-10-07 DIAGNOSIS — Z79899 Other long term (current) drug therapy: Secondary | ICD-10-CM | POA: Insufficient documentation

## 2012-10-07 DIAGNOSIS — H6980 Other specified disorders of Eustachian tube, unspecified ear: Secondary | ICD-10-CM | POA: Insufficient documentation

## 2012-10-07 DIAGNOSIS — E119 Type 2 diabetes mellitus without complications: Secondary | ICD-10-CM | POA: Insufficient documentation

## 2012-10-07 DIAGNOSIS — Z8781 Personal history of (healed) traumatic fracture: Secondary | ICD-10-CM | POA: Insufficient documentation

## 2012-10-07 DIAGNOSIS — F319 Bipolar disorder, unspecified: Secondary | ICD-10-CM | POA: Insufficient documentation

## 2012-10-07 DIAGNOSIS — H9209 Otalgia, unspecified ear: Secondary | ICD-10-CM | POA: Insufficient documentation

## 2012-10-07 DIAGNOSIS — H6990 Unspecified Eustachian tube disorder, unspecified ear: Secondary | ICD-10-CM | POA: Insufficient documentation

## 2012-10-07 MED ORDER — PREDNISONE 50 MG PO TABS: 50.0000 mg | ORAL_TABLET | Freq: Every day | ORAL | Status: DC

## 2012-10-07 MED ORDER — GUAIFENESIN ER 1200 MG PO TB12: 1.0000 | ORAL_TABLET | Freq: Two times a day (BID) | ORAL | Status: DC

## 2012-10-07 NOTE — ED Notes (Addendum)
Pt reports she has been seen here 2x for bilateral ear infection, has taken 2 abx. Continues to have "stuffiness" in R ear. A&Ox4, resp e/u. Also wants a physical exam for school

## 2012-10-07 NOTE — ED Provider Notes (Signed)
Medical screening examination/treatment/procedure(s) were performed by non-physician practitioner and as supervising physician I was immediately available for consultation/collaboration.   Juniper Cobey III, MD 10/07/12 2119 

## 2012-10-07 NOTE — ED Provider Notes (Signed)
History    CSN: [id number] Arrival date & time 10/07/12  1050  First MD Initiated Contact with Patient 10/07/12 1056     Chief Complaint  Patient presents with  . Otitis Media   (Consider location/radiation/quality/duration/timing/severity/associated sxs/prior Treatment) HPI Patient presents emergency department with left ear pain.  Patient, states she's seen here twice and given 2 different antibiotics.  Patient, states to having some discomfort and fullness, in the left ear patient denies taking any other medications other than ones prescribed.  Patient, states, that not any nausea, vomiting, headache, blurred vision, weakness, numbness, dizziness, chest pain or shortness of breath.  Patient, states, that nothing seems to make her condition, better or worse. Past Medical History  Diagnosis Date  . Diabetes mellitus   . Allergy   . Bipolar disorder     Dr. Noelle Penner in [facility]  . Hip fracture     left   Past Surgical History  Procedure Laterality Date  . Knee arthroscopy Right    History reviewed. No pertinent family history. History  Substance Use Topics  . Smoking status: Former Games developer  . Smokeless tobacco: Not on file  . Alcohol Use: Yes     Comment: rarely   OB History   Grav Para Term Preterm Abortions TAB SAB Ect Mult Living                 Review of Systems All other systems negative except as documented in the HPI. All pertinent positives and negatives as reviewed in the HPI. Allergies  Penicillins and Merthiolate  Home Medications   Current Outpatient Rx  Name  Route  Sig  Dispense  Refill  . antipyrine-benzocaine (AURALGAN) otic solution   Left Ear   Place 3 drops into the left ear every 2 (two) hours as needed for pain.   15 mL   0   . ciprofloxacin (CIPRO) 250 MG tablet   Oral   Take 1 tablet (250 mg total) by mouth 2 (two) times daily.   10 tablet   0   . Multiple Vitamins-Minerals (CENTRUM) tablet   Oral   Take 1 tablet by mouth daily.            BP 142/97  Pulse 83  Temp(Src) 97.3 F (36.3 C) (Oral)  Resp 16  Ht 5\' 6"  (1.676 m)  Wt 181 lb 14.4 oz (82.509 kg)  BMI 29.37 kg/m2  SpO2 100%  LMP 09/22/2012 Physical Exam  Nursing note and vitals reviewed. Constitutional: She is oriented to person, place, and time. She appears well-developed and well-nourished. No distress.  HENT:  Right Ear: Tympanic membrane normal.  Left Ear: No drainage, swelling or tenderness. No mastoid tenderness. Tympanic membrane is not injected, not scarred, not perforated, not erythematous, not retracted and not bulging. A middle ear effusion is present. No hemotympanum. No decreased hearing is noted.  Cardiovascular: Normal rate and regular rhythm.   Pulmonary/Chest: Effort normal and breath sounds normal.  Neurological: She is alert and oriented to person, place, and time. She exhibits normal muscle tone. Coordination normal.  Skin: Skin is warm and dry. No rash noted.    ED Course  Procedures (including critical care time) Patient be referred to ENT as needed advised her that she does not have a middle ear infection at this point, but residual symptoms from her previous infectious process.  Patient is advised to increase her fluid intake.  Told to return here as needed  MDM    Jamesetta Orleans  Briar Witherspoon, PA-C 10/07/12 1115

## 2013-10-05 ENCOUNTER — Encounter (HOSPITAL_COMMUNITY): Payer: Self-pay | Admitting: Emergency Medicine

## 2013-10-05 ENCOUNTER — Emergency Department (HOSPITAL_COMMUNITY)
Admission: EM | Admit: 2013-10-05 | Discharge: 2013-10-05 | Disposition: A | Discharge status: Home / Self Care | Payer: PRIVATE HEALTH INSURANCE | Attending: Emergency Medicine | Admitting: Emergency Medicine

## 2013-10-05 DIAGNOSIS — Z87891 Personal history of nicotine dependence: Secondary | ICD-10-CM | POA: Insufficient documentation

## 2013-10-05 DIAGNOSIS — Z8659 Personal history of other mental and behavioral disorders: Secondary | ICD-10-CM | POA: Insufficient documentation

## 2013-10-05 DIAGNOSIS — Z0000 Encounter for general adult medical examination without abnormal findings: Secondary | ICD-10-CM

## 2013-10-05 DIAGNOSIS — Z792 Long term (current) use of antibiotics: Secondary | ICD-10-CM | POA: Insufficient documentation

## 2013-10-05 DIAGNOSIS — IMO0002 Reserved for concepts with insufficient information to code with codable children: Secondary | ICD-10-CM | POA: Insufficient documentation

## 2013-10-05 DIAGNOSIS — E119 Type 2 diabetes mellitus without complications: Secondary | ICD-10-CM | POA: Insufficient documentation

## 2013-10-05 DIAGNOSIS — Z8781 Personal history of (healed) traumatic fracture: Secondary | ICD-10-CM | POA: Insufficient documentation

## 2013-10-05 DIAGNOSIS — R413 Other amnesia: Secondary | ICD-10-CM | POA: Insufficient documentation

## 2013-10-05 DIAGNOSIS — Z09 Encounter for follow-up examination after completed treatment for conditions other than malignant neoplasm: Secondary | ICD-10-CM | POA: Insufficient documentation

## 2013-10-05 DIAGNOSIS — Z880 Allergy status to penicillin: Secondary | ICD-10-CM | POA: Insufficient documentation

## 2013-10-05 DIAGNOSIS — Z79899 Other long term (current) drug therapy: Secondary | ICD-10-CM | POA: Insufficient documentation

## 2013-10-05 NOTE — ED Provider Notes (Signed)
Medical screening examination/treatment/procedure(s) were performed by non-physician practitioner and as supervising physician I was immediately available for consultation/collaboration.   EKG Interpretation None        Nicolo Tomko M Dudley Cooley, MD 10/05/13 1535 

## 2013-10-05 NOTE — ED Provider Notes (Signed)
CSN: [id number]     Arrival date & time 10/05/13  1238 History  This chart was scribed for non-physician practitioner Renne CriglerJoshua Orlandria Kissner working with Enid SkeensJoshua M Zavitz, MD by Carl Bestelina Holson, ED Scribe. This patient was seen in room TR10C/TR10C and the patient's care was started at 2:09 PM.    Chief Complaint  Patient presents with  . Follow-up    HPI Comments: Rhonda [name] is a 48 y.o. female with a history of DM who presents to the Emergency Department needing a physical exam for school.  The patient states that she has not been eating regularly because of financial problems and has been experiencing some memory loss.  The patient states that she has also lost about 20 pounds and walks a lot.  She denies abdominal pain as an associated symptom.  She denies having a PCP but states that she has someone that she regularly sees for annual PAP smears.  The patient states that she takes vitamins daily and Benadryl as needed.  The patient states that when she was originally diagnosed with DM she was taking medication to treat her symptoms.  She states that she has not checked her sugars or taken DM medication lately.   The history is provided by the patient. No language interpreter was used.    Past Medical History  Diagnosis Date  . Diabetes mellitus   . Allergy   . Bipolar disorder     [facility]  . Hip fracture     left   Past Surgical History  Procedure Laterality Date  . Knee arthroscopy Right    History reviewed. No pertinent family history. History  Substance Use Topics  . Smoking status: Former Games developermoker  . Smokeless tobacco: Not on file  . Alcohol Use: Yes     Comment: rarely   OB History   Grav Para Term Preterm Abortions TAB SAB Ect Mult Living                 Review of Systems  Constitutional: Positive for appetite change and unexpected weight change.  Gastrointestinal: Negative for abdominal pain.  All other systems reviewed and are negative.     Allergies   Penicillins and Merthiolate  Home Medications   Prior to Admission medications   Medication Sig Start Date End Date Taking? Authorizing Provider  antipyrine-benzocaine Lyla Son(AURALGAN) otic solution Place 3 drops into the left ear every 2 (two) hours as needed for pain. 09/28/12   Tiffany Irine SealG Greene, PA-C  ciprofloxacin (CIPRO) 250 MG tablet Take 1 tablet (250 mg total) by mouth 2 (two) times daily. 09/28/12   Tiffany Irine SealG Greene, PA-C  Guaifenesin 1200 MG TB12 Take 1 tablet (1,200 mg total) by mouth 2 (two) times daily. 10/07/12   Jamesetta Orleanshristopher W Lawyer, PA-C  Multiple Vitamins-Minerals (CENTRUM) tablet Take 1 tablet by mouth daily.      Historical Provider, MD  predniSONE (DELTASONE) 50 MG tablet Take 1 tablet (50 mg total) by mouth daily. May substitute strength to fit the 4 dollar list. 10/07/12   Carlyle Dollyhristopher W Lawyer, PA-C   Triage Vitals: BP 136/80  Pulse 73  Temp(Src) 98.1 F (36.7 C) (Oral)  Resp 16  Ht 5\' 6"  (1.676 m)  Wt 184 lb 6.4 oz (83.643 kg)  BMI 29.78 kg/m2  SpO2 99%  Physical Exam  Nursing note and vitals reviewed. Constitutional: She is oriented to person, place, and time. She appears well-developed and well-nourished.  HENT:  Head: Normocephalic and atraumatic.  Right Ear:  External ear normal.  Left Ear: External ear normal.  Nose: Nose normal.  Mouth/Throat: Oropharynx is clear and moist. No oropharyngeal exudate.  Eyes: Conjunctivae and EOM are normal.  Neck: Normal range of motion. Neck supple.  Cardiovascular: Normal rate, regular rhythm and normal heart sounds.   No murmur heard. Pulmonary/Chest: Effort normal. No respiratory distress.  Abdominal: Soft. She exhibits no distension. There is no tenderness. There is no rebound.  Musculoskeletal: Normal range of motion.  Lymphadenopathy:    She has no cervical adenopathy.  Neurological: She is alert and oriented to person, place, and time.  Skin: Skin is warm and dry.  Psychiatric: She has a normal mood and affect. Her  behavior is normal.    ED Course  Procedures (including critical care time)  DIAGNOSTIC STUDIES: Oxygen Saturation is 99% on room air, normal by my interpretation.    COORDINATION OF CARE: 2:11 PM- Discussed discharging the patient with a [facility] to [facility] and the patient agreed to the treatment plan.   Labs Review Labs Reviewed - No data to display  Imaging Review No results found.   EKG Interpretation None      Vital signs reviewed and are as follows: Filed Vitals:   10/05/13 1242  BP: 136/80  Pulse: 73  Temp: 98.1 F (36.7 C)  Resp: 16   Patient referred to PCP for complete routine PE.   MDM   Final diagnoses:  Normal physical exam   Normal exam. Patient needs routine primary care follow-up to fully address her concerns. No emergent condition suspected today.   I personally performed the services described in this documentation, which was scribed in my presence. The recorded information has been reviewed and is accurate.    Renne CriglerJoshua Innocence Schlotzhauer, PA-C 10/05/13 [phone]

## 2013-10-05 NOTE — ED Notes (Addendum)
Pt states she is getting ready to start school and she just wants to make sure she is healthy before she starts. She is worried that she doesn't eat all her food groups so she might be malnourished. She would also like someone to fill out her physical form for school

## 2013-10-05 NOTE — Discharge Instructions (Signed)
Please read and follow all provided instructions.  Your diagnoses today include:  1. Normal physical exam     Tests performed today include:  Vital signs. See below for your results today.   Medications prescribed:   None  Home care instructions:  Follow any educational materials contained in this packet.  Follow-up instructions: Please follow-up with your primary care provider as needed for further evaluation of your symptoms. [facility] for an appointment.   Return instructions:   Please return to the Emergency Department if you experience worsening symptoms.   Please return if you have any other emergent concerns.  Additional Information:  Your vital signs today were: BP 136/80   Pulse 73   Temp(Src) 98.1 F (36.7 C) (Oral)   Resp 16   Ht 5\' 6"  (1.676 m)   Wt 184 lb 6.4 oz (83.643 kg)   BMI 29.78 kg/m2   SpO2 99% If your blood pressure (BP) was elevated above 135/85 this visit, please have this repeated by your doctor within one month. ---------------

## 2013-11-04 ENCOUNTER — Emergency Department (HOSPITAL_COMMUNITY)
Admission: EM | Admit: 2013-11-04 | Discharge: 2013-11-04 | Disposition: A | Discharge status: Home / Self Care | Payer: PRIVATE HEALTH INSURANCE | Attending: Emergency Medicine | Admitting: Emergency Medicine

## 2013-11-04 ENCOUNTER — Encounter (HOSPITAL_COMMUNITY): Payer: Self-pay | Admitting: Emergency Medicine

## 2013-11-04 DIAGNOSIS — Z79899 Other long term (current) drug therapy: Secondary | ICD-10-CM | POA: Insufficient documentation

## 2013-11-04 DIAGNOSIS — F22 Delusional disorders: Secondary | ICD-10-CM | POA: Insufficient documentation

## 2013-11-04 DIAGNOSIS — Z87891 Personal history of nicotine dependence: Secondary | ICD-10-CM | POA: Insufficient documentation

## 2013-11-04 DIAGNOSIS — Z87311 Personal history of (healed) other pathological fracture: Secondary | ICD-10-CM | POA: Insufficient documentation

## 2013-11-04 DIAGNOSIS — F319 Bipolar disorder, unspecified: Secondary | ICD-10-CM

## 2013-11-04 DIAGNOSIS — Z3202 Encounter for pregnancy test, result negative: Secondary | ICD-10-CM | POA: Insufficient documentation

## 2013-11-04 DIAGNOSIS — Z880 Allergy status to penicillin: Secondary | ICD-10-CM | POA: Insufficient documentation

## 2013-11-04 DIAGNOSIS — R4789 Other speech disturbances: Secondary | ICD-10-CM | POA: Insufficient documentation

## 2013-11-04 DIAGNOSIS — E119 Type 2 diabetes mellitus without complications: Secondary | ICD-10-CM | POA: Insufficient documentation

## 2013-11-04 DIAGNOSIS — Z008 Encounter for other general examination: Secondary | ICD-10-CM | POA: Insufficient documentation

## 2013-11-04 DIAGNOSIS — F411 Generalized anxiety disorder: Secondary | ICD-10-CM | POA: Insufficient documentation

## 2013-11-04 DIAGNOSIS — F909 Attention-deficit hyperactivity disorder, unspecified type: Secondary | ICD-10-CM | POA: Insufficient documentation

## 2013-11-04 LAB — COMPREHENSIVE METABOLIC PANEL
ALBUMIN: 3.6 g/dL (ref 3.5–5.2)
ALT: 13 U/L (ref 0–35)
ANION GAP: 10 (ref 5–15)
AST: 20 U/L (ref 0–37)
Alkaline Phosphatase: 60 U/L (ref 39–117)
BUN: 11 mg/dL (ref 6–23)
CALCIUM: 9.2 mg/dL (ref 8.4–10.5)
CHLORIDE: 102 meq/L (ref 96–112)
CO2: 26 mEq/L (ref 19–32)
CREATININE: 0.77 mg/dL (ref 0.50–1.10)
GFR calc Af Amer: >90 mL/min (ref >90)
GLUCOSE: 120 mg/dL — AB (ref 70–99)
Potassium: 3.4 mEq/L — ABNORMAL LOW (ref 3.7–5.3)
Sodium: 138 mEq/L (ref 137–147)
TOTAL PROTEIN: 7.5 g/dL (ref 6.0–8.3)
Total Bilirubin: 0.4 mg/dL (ref 0.3–1.2)

## 2013-11-04 LAB — CBC
HCT: 34.6 % — ABNORMAL LOW (ref 36.0–46.0)
Hemoglobin: 11.3 g/dL — ABNORMAL LOW (ref 12.0–15.0)
MCH: 27 pg (ref 26.0–34.0)
MCHC: 32.7 g/dL (ref 30.0–36.0)
MCV: 82.8 fL (ref 78.0–100.0)
PLATELETS: 378 10*3/uL (ref 150–400)
RBC: 4.18 MIL/uL (ref 3.87–5.11)
RDW: 15.2 % (ref 11.5–15.5)
WBC: 9.3 10*3/uL (ref 4.0–10.5)

## 2013-11-04 LAB — RAPID URINE DRUG SCREEN, HOSP PERFORMED
AMPHETAMINES: NOT DETECTED
Barbiturates: NOT DETECTED
Benzodiazepines: NOT DETECTED
Cocaine: NOT DETECTED
Opiates: NOT DETECTED
Tetrahydrocannabinol: NOT DETECTED

## 2013-11-04 LAB — ACETAMINOPHEN LEVEL: Acetaminophen (Tylenol), Serum: <15 ug/mL (ref 10–30)

## 2013-11-04 LAB — POC URINE PREG, ED: Preg Test, Ur: NEGATIVE

## 2013-11-04 LAB — ETHANOL

## 2013-11-04 LAB — SALICYLATE LEVEL

## 2013-11-04 MED ORDER — POTASSIUM CHLORIDE CRYS ER 20 MEQ PO TBCR
40.0000 meq | EXTENDED_RELEASE_TABLET | Freq: Once | ORAL | Status: AC
  Administered 2013-11-04: 40 meq via ORAL
  Filled 2013-11-04: qty 2

## 2013-11-04 NOTE — ED Notes (Signed)
Pt presents with [facility] under IVC for medical clearance, pt is to return to [facility] if clear. Pt states she has been off meds x 8 years. Pt states she does not have finances to eat everyday. Pt denies SI/HI

## 2013-11-04 NOTE — Discharge Instructions (Signed)
Go to [facility] now to finish your evaluation.

## 2013-11-04 NOTE — ED Provider Notes (Signed)
CSN: [id number]     Arrival date & time 11/04/13  2108 History   First MD Initiated Contact with Patient 11/04/13 2145     Chief Complaint  Patient presents with  . Medical Clearance     (Consider location/radiation/quality/duration/timing/severity/associated sxs/prior Treatment) HPI Patient presents to the emergency department from [facility] for medical clearance. Pt has hx of bipolar disorder and states she stopped taking her medications about 8 years ago while she was under the care of Dr Dub MikesLugo, with his consent. 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 She reports her father pays for her condo because she isn't working and he and her mother got upset when she didn't want to talk to them. Her parents filled out commitment papers stating she is walking around all night, talking about religion and telling them her father is a devil. They state she refuses to take her medications. Pt was seen at [facility] today and presents for medical clearance and will return to [facility]. They report she was last admitted to a psychiatric hospital in 2012, but she states she was in school at that time.   PCP none  Past Medical History  Diagnosis Date  . Diabetes mellitus   . Allergy   . Bipolar disorder     [facility]bs [facility]  . Hip fracture     left   Past Surgical History  Procedure Laterality Date  . Knee arthroscopy Right    No family history on file. History  Substance Use Topics  . Smoking status: Former Games developermoker  . Smokeless tobacco: Not on file  . Alcohol Use: No  lives alone student  OB History   Grav Para Term Preterm Abortions TAB SAB Ect Mult Living                 Review of Systems  All other systems reviewed and are negative.     Allergies  Penicillins and Merthiolate  Home Medications   Prior to Admission medications    Medication Sig Start Date End Date Taking? Authorizing Provider  aspirin 325 MG tablet Take 325 mg by mouth daily as needed for mild pain.   Yes Historical Provider, MD  Multiple Vitamins-Minerals (CENTRUM) tablet Take 1 tablet by mouth daily.     Yes Historical Provider, MD   BP 147/82  Pulse 82  Temp(Src) 98.6 F (37 C) (Oral)  Resp 20  Ht 5\' 6"  (1.676 m)  Wt 180 lb (81.647 kg)  BMI 29.07 kg/m2  SpO2 100%  LMP 10/31/2013  Vital signs normal   Physical Exam  Nursing note and vitals reviewed. Constitutional: She is oriented to person, place, and time. She appears well-developed and well-nourished.  Non-toxic appearance. She does not appear ill. No distress.  HENT:  Head: Normocephalic and atraumatic.  Right Ear: External ear normal.  Left Ear: External ear normal.  Nose: Nose normal. No mucosal edema or rhinorrhea.  Mouth/Throat: Oropharynx is clear and moist and mucous membranes are normal. No dental abscesses or uvula swelling.  Eyes: Conjunctivae and EOM are normal. Pupils are equal, round, and reactive to light.  Neck: Normal range of motion and full passive range of motion without pain. Neck supple.  Cardiovascular: Normal rate, regular rhythm and normal heart sounds.  Exam reveals no gallop and no friction rub.   No murmur heard.  Pulmonary/Chest: Effort normal and breath sounds normal. No respiratory distress. She has no wheezes. She has no rhonchi. She has no rales. She exhibits no tenderness and no crepitus.  Abdominal: Soft. Normal appearance and bowel sounds are normal. She exhibits no distension. There is no tenderness. There is no rebound and no guarding.  Musculoskeletal: Normal range of motion. She exhibits no edema and no tenderness.  Moves all extremities well.   Neurological: She is alert and oriented to person, place, and time. She has normal strength. No cranial nerve deficit.  Skin: Skin is warm, dry and intact. No rash noted. No erythema. No pallor.   Psychiatric: Her mood appears anxious. Her speech is rapid and/or pressured. She is hyperactive. Thought content is paranoid.    ED Course  Procedures (including critical care time)  Medications  potassium chloride SA (K-DUR,KLOR-CON) CR tablet 40 mEq (40 mEq Oral Given 11/04/13 2257)   Pt was discharge back to [facility] with their security personell.   Labs Review Results for orders placed during the hospital encounter of 11/04/13  ACETAMINOPHEN LEVEL      Result Value Ref Range   Acetaminophen (Tylenol), Serum <15.0  10 - 30 ug/mL  CBC      Result Value Ref Range   WBC 9.3  4.0 - 10.5 K/uL   RBC 4.18  3.87 - 5.11 MIL/uL   Hemoglobin 11.3 (*) 12.0 - 15.0 g/dL   HCT 40.9 (*) 81.1 - 91.4 %   MCV 82.8  78.0 - 100.0 fL   MCH 27.0  26.0 - 34.0 pg   MCHC 32.7  30.0 - 36.0 g/dL   RDW 78.2  95.6 - 21.3 %   Platelets 378  150 - 400 K/uL  COMPREHENSIVE METABOLIC PANEL      Result Value Ref Range   Sodium 138  137 - 147 mEq/L   Potassium 3.4 (*) 3.7 - 5.3 mEq/L   Chloride 102  96 - 112 mEq/L   CO2 26  19 - 32 mEq/L   Glucose, Bld 120 (*) 70 - 99 mg/dL   BUN 11  6 - 23 mg/dL   Creatinine, Ser 0.86  0.50 - 1.10 mg/dL   Calcium 9.2  8.4 - 57.8 mg/dL   Total Protein 7.5  6.0 - 8.3 g/dL   Albumin 3.6  3.5 - 5.2 g/dL   AST 20  0 - 37 U/L   ALT 13  0 - 35 U/L   Alkaline Phosphatase 60  39 - 117 U/L   Total Bilirubin 0.4  0.3 - 1.2 mg/dL   GFR calc non Af Amer >90  >90 mL/min   GFR calc Af Amer >90  >90 mL/min   Anion gap 10  5 - 15  ETHANOL      Result Value Ref Range   Alcohol, Ethyl (B) <11  0 - 11 mg/dL  SALICYLATE LEVEL      Result Value Ref Range   Salicylate Lvl <2.0 (*) 2.8 - 20.0 mg/dL  URINE RAPID DRUG SCREEN (HOSP PERFORMED)      Result Value Ref Range   Opiates NONE DETECTED  NONE DETECTED   Cocaine NONE DETECTED  NONE DETECTED   Benzodiazepines NONE DETECTED  NONE DETECTED   Amphetamines NONE DETECTED  NONE DETECTED   Tetrahydrocannabinol NONE DETECTED  NONE  DETECTED   Barbiturates NONE DETECTED  NONE DETECTED  POC URINE PREG, ED      Result Value Ref Range   Preg Test, Ur NEGATIVE  NEGATIVE   Laboratory interpretation all normal except mild hypokalemia     Imaging Review No results found.   EKG Interpretation None      MDM   Final diagnoses:  Bipolar disorder, unspecified     Plan discharge  Devoria Albe, MD, Franz Dell, MD 11/04/13 [phone]

## 2016-12-13 ENCOUNTER — Encounter (HOSPITAL_COMMUNITY): Payer: Self-pay | Admitting: Emergency Medicine

## 2016-12-13 ENCOUNTER — Emergency Department (HOSPITAL_COMMUNITY)
Admission: EM | Admit: 2016-12-13 | Discharge: 2016-12-13 | Disposition: A | Discharge status: Home / Self Care | Payer: Self-pay | Attending: Emergency Medicine | Admitting: Emergency Medicine

## 2016-12-13 DIAGNOSIS — R739 Hyperglycemia, unspecified: Secondary | ICD-10-CM

## 2016-12-13 DIAGNOSIS — Z87891 Personal history of nicotine dependence: Secondary | ICD-10-CM | POA: Insufficient documentation

## 2016-12-13 DIAGNOSIS — N898 Other specified noninflammatory disorders of vagina: Secondary | ICD-10-CM | POA: Insufficient documentation

## 2016-12-13 DIAGNOSIS — E1165 Type 2 diabetes mellitus with hyperglycemia: Secondary | ICD-10-CM | POA: Insufficient documentation

## 2016-12-13 DIAGNOSIS — Z79899 Other long term (current) drug therapy: Secondary | ICD-10-CM | POA: Insufficient documentation

## 2016-12-13 LAB — URINALYSIS, ROUTINE W REFLEX MICROSCOPIC
BILIRUBIN URINE: NEGATIVE
Glucose, UA: >=500 mg/dL — AB
Ketones, ur: NEGATIVE mg/dL
NITRITE: NEGATIVE
PH: 5 (ref 5.0–8.0)
Protein, ur: NEGATIVE mg/dL
SPECIFIC GRAVITY, URINE: 1.025 (ref 1.005–1.030)

## 2016-12-13 LAB — WET PREP, GENITAL
Clue Cells Wet Prep HPF POC: NONE SEEN
SPERM: NONE SEEN
Trich, Wet Prep: NONE SEEN
YEAST WET PREP: NONE SEEN

## 2016-12-13 LAB — PREGNANCY, URINE: Preg Test, Ur: NEGATIVE

## 2016-12-13 LAB — CBG MONITORING, ED: Glucose-Capillary: 357 mg/dL — ABNORMAL HIGH (ref 65–99)

## 2016-12-13 MED ORDER — SODIUM CHLORIDE 0.9 % IV BOLUS (SEPSIS): 1000.0000 mL | Freq: Once | INTRAVENOUS | Status: DC

## 2016-12-13 NOTE — Discharge Instructions (Signed)
Follow-up with your PCP for diabetes and high blood sugar. Follow-up with OB/GYN for further evaluation of vaginal irritation. Return to ED for worsening pain, severe abdominal pain, fevers, vomiting.

## 2016-12-13 NOTE — ED Triage Notes (Signed)
PT reports new partner and having intercourse with virulence. Having occasional pain and bleeding after. States she feels like "something in there." denies odors.

## 2016-12-13 NOTE — ED Provider Notes (Signed)
[facility]-EMERGENCY DEPT Provider Note   CSN: [id number] Arrival date & time: 12/13/16  1112     History   Chief Complaint No chief complaint on file.   HPI Rhonda [name] is a 51 y.o. female.  HPI  Patient presents to ED for evaluation of vaginal irritation, foreign body sensation in her vagina that has been intermittent for the past 1 month. She states that she feels as though there is a tampon stuck in her vagina. She states that this sensation comes and goes. She has been sexually active with 1 partner since April with no protection. States that she has tried to make sure that there is not a tampon but she still having the sensation. She also reports vaginal discharge. She denies any abdominal pain, dysuria, hematuria, abnormal bleeding, diarrhea, constipation.  Past Medical History:  Diagnosis Date  . Allergy   . Bipolar disorder (HCC)    Dr. Noelle Penner in [facility]  . Diabetes mellitus   . Hip fracture (HCC)    left    There are no active problems to display for this patient.   Past Surgical History:  Procedure Laterality Date  . KNEE ARTHROSCOPY Right     OB History    No data available       Home Medications    Prior to Admission medications   Medication Sig Start Date End Date Taking? Authorizing Provider  aspirin 325 MG tablet Take 325 mg by mouth daily as needed for mild pain.    [provider]  Multiple Vitamins-Minerals (CENTRUM) tablet Take 1 tablet by mouth daily.      [provider]    Family History History reviewed. No pertinent family history.  Social History Social History  Substance Use Topics  . Smoking status: Former Games developer  . Smokeless tobacco: Not on file  . Alcohol use No     Allergies   Penicillins and Merthiolate [benzalkonium chloride]   Review of Systems Review of Systems  Constitutional: Negative for appetite change, chills and fever.  HENT: Negative for ear pain, rhinorrhea, sneezing and sore throat.   Eyes:  Negative for photophobia and visual disturbance.  Respiratory: Negative for cough, chest tightness, shortness of breath and wheezing.   Cardiovascular: Negative for chest pain and palpitations.  Gastrointestinal: Negative for abdominal pain, blood in stool, constipation, diarrhea, nausea and vomiting.  Genitourinary: Positive for vaginal discharge and vaginal pain. Negative for dysuria, hematuria and urgency.  Musculoskeletal: Negative for myalgias.  Skin: Negative for rash.  Neurological: Negative for dizziness, weakness and light-headedness.     Physical Exam Updated Vital Signs BP (!) 152/93 (BP Location: Right Arm)   Pulse 66   Temp 97.9 F (36.6 C) (Oral)   Resp 18   SpO2 100%   Physical Exam  Constitutional: She appears well-developed and well-nourished. No distress.  HENT:  Head: Normocephalic and atraumatic.  Nose: Nose normal.  Eyes: Conjunctivae and EOM are normal. Left eye exhibits no discharge. No scleral icterus.  Neck: Normal range of motion. Neck supple.  Cardiovascular: Normal rate, regular rhythm, normal heart sounds and intact distal pulses.  Exam reveals no gallop and no friction rub.   No murmur heard. Pulmonary/Chest: Effort normal and breath sounds normal. No respiratory distress.  Abdominal: Soft. Bowel sounds are normal. She exhibits no distension. There is no tenderness. There is no guarding.  Genitourinary:  Genitourinary Comments: Pelvic exam: normal external genitalia without evidence of trauma. VULVA: normal appearing vulva with no masses, tenderness or lesion.  VAGINA: normal appearing vagina with normal color and discharge, no lesions. No foreign bodies present. CERVIX: normal appearing cervix without lesions, cervical motion tenderness absent, cervical os closed with out purulent discharge; white vaginal discharge noted.Wet prep and DNA probe for chlamydia and GC obtained.   ADNEXA: normal adnexa in size, nontender and no masses UTERUS: uterus is  normal size, shape, consistency and nontender.    Musculoskeletal: Normal range of motion. She exhibits no edema.  Neurological: She is alert. She exhibits normal muscle tone. Coordination normal.  Skin: Skin is warm and dry. No rash noted.  Psychiatric: She has a normal mood and affect.  Nursing note and vitals reviewed.    ED Treatments / Results  Labs (all labs ordered are listed, but only abnormal results are displayed) Labs Reviewed  WET PREP, GENITAL - Abnormal; Notable for the following:       Result Value   WBC, Wet Prep HPF POC MANY (*)    All other components within normal limits  URINALYSIS, ROUTINE W REFLEX MICROSCOPIC - Abnormal; Notable for the following:    Color, Urine STRAW (*)    Glucose, UA >=500 (*)    Hgb urine dipstick SMALL (*)    Leukocytes, UA TRACE (*)    Bacteria, UA RARE (*)    Squamous Epithelial / LPF 0-5 (*)    All other components within normal limits  CBG MONITORING, ED - Abnormal; Notable for the following:    Glucose-Capillary 357 (*)    All other components within normal limits  PREGNANCY, URINE  GC/CHLAMYDIA PROBE AMP ([place]) NOT AT [facility]    EKG  EKG Interpretation None       Radiology No results found.  Procedures Procedures (including critical care time)  Medications Ordered in ED Medications  sodium chloride 0.9 % bolus 1,000 mL (not administered)     Initial Impression / Assessment and Plan / ED Course  I have reviewed the triage vital signs and the nursing notes.  Pertinent labs & imaging results that were available during my care of the patient were reviewed by me and considered in my medical decision making (see chart for details).     Patient presents to ED for evaluation of vaginal irritation, foreign body sensation intermittent for the past 1 month. She has been sexually active with 1 partner since April. She also reports vaginal discharge. She is concerned that there is a retained tampon in her vagina, she  has a sensation of tampon in her vagina that has been intermittent. On physical exam she is nontoxic-appearing and in no acute distress. She has no abdominal tenderness to palpation. Pelvic exam revealed scant white vaginal discharge but no retained foreign body. Wet prep negative but did show WBC. Urinalysis with significant amount of glucose. Patient's CBG was 357. Urine pregnancy negative. Gonorrhea and chlamydia pending. She declined empiric treatment for gonorrhea and chlamydia at this time. Patient states that she does have a history of diabetes but states that it has been controlled for the past 15 years. She was on pills and insulin 15 years ago. Patient states that she does not have time to receive IV fluids but she will follow-up with her PCP for further evaluation of her hyperglycemia. I also encouraged her to follow up with her OB/GYN for further evaluation of her vaginal irritation. I encouraged her to follow-up with the results of her remaining lab work. Patient appears stable for discharge at this time. Strict return precautions given.  Final  Clinical Impressions(s) / ED Diagnoses   Final diagnoses:  Vaginal irritation  Hyperglycemia    New Prescriptions New Prescriptions   No medications on file     Dietrich Pates, Cordelia Poche 12/13/16 1805    Loren Racer, MD 12/14/16 [phone]

## 2016-12-14 LAB — GC/CHLAMYDIA PROBE AMP (~~LOC~~) NOT AT ARMC
Chlamydia: NEGATIVE
Neisseria Gonorrhea: NEGATIVE

## 2017-03-16 ENCOUNTER — Other Ambulatory Visit: Payer: Self-pay

## 2017-03-16 ENCOUNTER — Ambulatory Visit: Payer: Self-pay | Admitting: Family Medicine

## 2017-03-16 ENCOUNTER — Encounter: Payer: Self-pay | Admitting: Family Medicine

## 2017-03-16 VITALS — BP 132/80 | HR 108 | Temp 98.6°F | Resp 18 | Ht 66.0 in | Wt 178.2 lb

## 2017-03-16 DIAGNOSIS — E1165 Type 2 diabetes mellitus with hyperglycemia: Secondary | ICD-10-CM

## 2017-03-16 DIAGNOSIS — R102 Pelvic and perineal pain: Secondary | ICD-10-CM

## 2017-03-16 LAB — POCT URINALYSIS DIP (MANUAL ENTRY)
BILIRUBIN UA: NEGATIVE
LEUKOCYTES UA: NEGATIVE
NITRITE UA: NEGATIVE
Protein Ur, POC: NEGATIVE mg/dL
Spec Grav, UA: <=1.005 — AB (ref 1.010–1.025)
UROBILINOGEN UA: 0.2 U/dL
pH, UA: 5 (ref 5.0–8.0)

## 2017-03-16 LAB — POCT WET + KOH PREP: Trich by wet prep: ABSENT

## 2017-03-16 LAB — POCT GLYCOSYLATED HEMOGLOBIN (HGB A1C)

## 2017-03-16 LAB — POC MICROSCOPIC URINALYSIS (UMFC): MUCUS RE: ABSENT

## 2017-03-16 LAB — GLUCOSE, POCT (MANUAL RESULT ENTRY): POC Glucose: >444 mg/dl (ref 70–99)

## 2017-03-16 MED ORDER — FLUCONAZOLE 150 MG PO TABS: 150.0000 mg | ORAL_TABLET | Freq: Once | ORAL | 0 refills | Status: AC

## 2017-03-16 MED ORDER — METRONIDAZOLE 500 MG PO TABS: 500.0000 mg | ORAL_TABLET | Freq: Two times a day (BID) | ORAL | 0 refills | Status: DC

## 2017-03-16 NOTE — Patient Instructions (Addendum)
The orange card program is sponsored by the [facility]. Call [phone] to see if this might be something for which you are eligible. This can ensure you have access to the medical and dental services that [facility] and [facility] help provide to people without health insurance.  Please call the clinic below to get an appointment asap.  They are a clinic set up by [facility] to care for people without health insurance so may be able to get you [facility] cheaper cost with a HUGE discount on labs, imaging, and medication. [facility] [address] , [place] [id number] Hours of Operation Mon - Fri: 9 a.m. - 6 p.m. Main: [phone]     IF you received an x-ray today, you will receive an invoice from [facility]. Please contact [facility] at [phone] with questions or concerns regarding your invoice.   IF you received labwork today, you will receive an invoice from [facility]. Please contact [facility] at [phone] with questions or concerns regarding your invoice.   Our billing staff will not be able to assist you with questions regarding bills from these companies.  You will be contacted with the lab results as soon as they are available. The fastest way to get your results is to activate your My Chart account. Instructions are located on the last page of this paperwork. If you have not heard from us regarding the results in 2 weeks, please contact this office.     Insulin Treatment for Diabetes Diabetes (diabetes mellitus) is a long-term (chronic) disease. It occurs when the body does not properly use sugar (glucose) that is released from food after digestion. Glucose levels are controlled by a hormone called insulin, which is made in the pancreas.  If you have type 1 diabetes, the pancreas does not make any insulin, so you must take insulin.  If you have type 2 diabetes, you might  need to take insulin along with other medicines. In type 2 diabetes, one or both of these problems may be present: ? The pancreas does not make enough insulin. ? Cells in the body do not respond properly to insulin that the body makes (insulin resistance).  You must use insulin correctly to control your diabetes. You must have some insulin in your body at all times. Insulin treatment varies depending on your type of diabetes, your treatment goals, and your medical history. It is important for you to understand your insulin treatment plan so you can be an active partner in managing your diabetes. How is insulin given? Insulin can only be given through a shot (injection). It is injected using a syringe and needle, an insulin pen, a pump, or a jet injector. Your health care provider will:  Prescribe the amount and type of insulin that you need.  Tell you when you should inject your insulin.  Where on the body should insulin be injected? Insulin is injected into a layer of fatty tissue under the skin. Good places to inject insulin include:  Abdomen. Generally, the abdomen is the best place to inject insulin. However, you should avoid any area that is less than 2 inches (5 cm) from the belly button (navel).  Front and outer area of the upper thighs.  The back of the upper arms.  Upper buttocks.  It is important to:  Give your injection in a slightly different place each time. This helps to prevent irritation and improve absorption.  Avoid injecting into areas that have scar tissue.  Usually, you will give yourself insulin injections. Others can also be taught how to give you injections. You will use a special type of syringe that is made only for insulin. Some people may have an insulin pump that delivers insulin steadily through a tube (cannula) that is placed under the skin. What are the different types of insulin? The following information is a general guide to different types of  insulin. Specifics vary depending on the insulin product that your health care provider prescribes.  Rapid-acting insulin: ? Starts working quickly, in as little as 5 minutes. ? Can last for 4-6 hours, or sometimes longer. ? Works well when taken right before a meal to quickly lower blood glucose.  Short-acting insulin: ? Starts working in about 30 minutes. ? Can last for 6-10 hours. ? Should be taken about 30 minutes before you start eating a meal.  Intermediate-acting insulin: ? Starts working in 1-2 hours. ? Lasts for about 10-18 hours. ? Lowers your blood glucose for a longer period of time but is not as effective for lowering blood glucose right after a meal.  Long-acting insulin: ? Mimics the small amount of insulin that your pancreas usually produces throughout the day. ? Should be used either one or two times a day. ? Is usually used in combination with other types of insulin or other medicines.  Concentrated insulin, or U-500 insulin: ? Contains a higher dose of insulin than most rapid-acting insulins. U-500 insulin has 5 times the amount of insulin per 1 mL. ? Should only be used with the special U-500 syringe or U-500 insulin pen. It is dangerous to use the wrong type of syringe with this insulin.  What are the side effects of insulin? Possible side effects of insulin treatment include:  Low blood glucose (hypoglycemia).  Weight gain.  High blood glucose (hyperglycemia).  Skin injury or irritation.  Some of these side effects can be caused by using improper injection technique. It is important to learn to inject insulin properly. What are common terms associated with insulin treatment? Some terms that you might hear include:  Basal insulin, or basal rate. This is the constant amount of insulin that needs to be present in your body to stabilize your blood glucose levels. People who have type 1 diabetes need basal insulin in a steady (continuous) dose 24 hours a  day. ? Usually, intermediate-acting or long-acting insulin is used one or two times a day to manage basal insulin levels. ? Medicines that are taken by mouth may also be recommended to manage basal insulin levels.  Prandial insulin. This refers to meal-related insulin. ? Blood glucose rises quickly after a meal (postprandial). Rapid-acting or short-acting insulin can be used right before a meal (preprandial) to quickly lower blood glucose. ? You may be instructed to adjust the amount of prandial insulin that you take depending on how much carbohydrate (starch) is in your meal.  Corrective insulin. This may also be called a correction dose or supplemental dose. This is a small amount of rapid-acting or short-acting insulin that can be used to lower blood glucose if it is too high. You may be instructed to check your blood glucose at certain times of the day and use corrective insulin as needed.  Tight control, or intensive therapy. This means keeping your blood glucose as close to your target as possible, and preventing it from getting too high after meals. People who have tight control of  their diabetes have fewer long-term problems caused by diabetes.  General instructions  Talk with your health care provider or pharmacist about the type of insulin you should take and when you should take it. You should know when your insulin peaks and when it wears off. You need this information so you can plan your meals and exercise. You also need to work with your health care provider to:  Check your blood glucose every day. Your health care provider will tell you how often and when you should do this.  Manage your: ? Weight. ? Blood pressure. ? Cholesterol. ? Stress.  Eat a healthy diet.  Exercise regularly.  This information is not intended to replace advice given to you by your health care provider. Make sure you discuss any questions you have with your health care provider. Document Released:  06/15/2008 Document Revised: 08/25/2015 Document Reviewed: 04/22/2015 Elsevier Interactive Patient Education  2018 [employer].  Hyperglycemic Hyperosmolar State Hyperglycemic hyperosmolar state is a serious condition in which you experience an extreme increase in your blood sugar (glucose) level. This makes your body become extremely dehydrated, which can be life-threatening. This condition is a result of uncontrolled or undiagnosed diabetes. It occurs most often in people who have type 2 diabetes (type 2 diabetes mellitus). Certain hormones (insulin and glucagon) control the level of glucose that is in the blood. Insulin lowers blood glucose, and glucagon increases blood glucose. Hyperglycemia can result from having too little insulin in the bloodstream, or from the body not responding normally to insulin. Normally, the body gets rid of excess glucose through urine. If you do not drink enough fluids, or if you drink fluids that contain sugar, your body cannot get rid of excess glucose. This can result in hyperglycemic hyperosmolar state. What are the causes? This condition may be caused by:  Infection.  Medicines that cause you to become dehydrated or cause you to lose fluid.  Certain illnesses.  Not taking your diabetes medicine.  New onset or diagnosis of diabetes.  Cardiovascular disease (CVD).  What increases the risk? The following factors make you more likely to develop this condition:  Older age.  Poor management of diabetes.  Inability to eat or drink normally.  Heart failure.  Infection.  Surgery.  Illness.  What are the signs or symptoms? Symptoms of this condition include:  Extreme or increased thirst. This symptom may gradually disappear.  Needing to urinate more often than usual.  Dry mouth.  Warm, dry skin that does not sweat even in high temperatures.  High fever.  Sleepiness or confusion.  Vision problems or vision loss.  Seeing, hearing,  tasting, smelling, or feeling things that are not real (hallucinations).  Weakness.  Weight loss.  Vomiting.  How is this diagnosed? Hyperglycemic hyperosmolar state is diagnosed based on your medical history, your symptoms, and a blood test to measure your blood glucose level. How is this treated? This condition is treated in the hospital. The goals of treatment are:  To correct dehydration by replacing fluids that you have lost. Fluids will be given through an IV tube.  To improve blood sugar levels using insulin or other medicines as needed.  To treat the cause of hyperglycemia, such as an infection, illness, or newly diagnosed diabetes.  Follow these instructions at home: General instructions  Take over-the-counter and prescription medicines only as told by your health care provider.  Do not use any products that contain nicotine or tobacco, such as cigarettes and e-cigarettes. If you  need help quitting, ask your health care provider.  Limit alcohol intake to no more than 1 drink a day for nonpregnant women and 2 drinks a day for men. One drink equals 12 oz of beer, 5 oz of wine, or 1 oz of hard liquor.  Stay hydrated, especially when you exercise, when you get sick, or when you spend time in hot temperatures.  Learn to manage stress. If you need help with this, ask your health care provider.  Keep all follow-up visits as told by your health care provider. This is important. Eating and drinking  Maintain a healthy weight.  Exercise regularly, as directed by your health care provider.  Eat healthy foods, such as: ? Lean proteins. ? Complex carbohydrates. ? Fresh fruits and vegetables. ? Low-fat dairy products. ? Healthy fats.  Drink enough fluid to keep your urine clear or pale yellow. If You Have Diabetes:   Make sure you know the early signs and symptoms of hyperglycemia.  Follow your diabetes management plan, as told by your health care provider. Make sure  you: ? Take your insulin and medicines as directed. ? Follow your exercise plan. ? Follow your meal plan. Eat on time, and do not skip meals. ? Check your blood glucose as often as directed. Make sure to check your blood glucose before and after exercise. If you exercise longer or in a different way than usual, check your blood glucose more often. ? Follow your sick day plan whenever you cannot eat or drink normally. Make this plan in advance with your health care provider.  Share your diabetes management plan with people in your workplace, school, and household.  Check your urine for ketones when you are ill and as often as told by your health care provider.  Carry a medical alert card or wear medical alert jewelry. Contact a health care provider if:  You cannot eat or drink without throwing up.  You develop a fever. Get help right away if:  You develop symptoms of hyperglycemic hyperosmolar state. These symptoms may represent a serious problem that is an emergency. Do not wait to see if the symptoms will go away. Get medical help right away. Call your local emergency services (911 in the [place] Do not drive yourself to the hospital. Summary  Hyperglycemic hyperosmolar state is a serious condition in which you experience an extreme increase in your blood sugar (glucose) level. This makes your body become extremely dehydrated, which can be life-threatening.  This condition is a result of uncontrolled or undiagnosed diabetes. It occurs most often in people who have type 2 diabetes (type 2 diabetes mellitus).  This condition is treated in the hospital. Treatment may include fluids given through an IV tube and other medicines.  Make sure you know the early signs and symptoms of hyperglycemia.  Follow your diabetes management plan, as told by your health care provider. This information is not intended to replace advice given to you by your health care provider. Make sure you discuss any  questions you have with your health care provider. Document Released: 01/20/2004 Document Revised: 03/12/2016 Document Reviewed: 03/12/2016 Elsevier Interactive Patient Education  2018 [employer].  Hyperglycemia Hyperglycemia occurs when the level of sugar (glucose) in the blood is too high. Glucose is a type of sugar that provides the body's main source of energy. Certain hormones (insulin and glucagon) control the level of glucose in the blood. Insulin lowers blood glucose, and glucagon increases blood glucose. Hyperglycemia can result from having  too little insulin in the bloodstream, or from the body not responding normally to insulin. Hyperglycemia occurs most often in people who have diabetes (diabetes mellitus), but it can happen in people who do not have diabetes. It can develop quickly, and it can be life-threatening if it causes you to become severely dehydrated (diabetic ketoacidosis or hyperglycemic hyperosmolar state). Severe hyperglycemia is a medical emergency. What are the causes? If you have diabetes, hyperglycemia may be caused by:  Diabetes medicine.  Medicines that increase blood glucose or affect your diabetes control.  Not eating enough, or not eating often enough.  Changes in physical activity level.  Being sick or having an infection.  If you have prediabetes or undiagnosed diabetes:  Hyperglycemia may be caused by those conditions.  If you do not have diabetes, hyperglycemia may be caused by:  Certain medicines, including steroid medicines, beta-blockers, epinephrine, and thiazide diuretics.  Stress.  Serious illness.  Surgery.  Diseases of the pancreas.  Infection.  What increases the risk? Hyperglycemia is more likely to develop in people who have risk factors for diabetes, such as:  Having a family member with diabetes.  Having a gene for type 1 diabetes that is passed from parent to child (inherited).  Living in an area with cold weather  conditions.  Exposure to certain viruses.  Certain conditions in which the body's disease-fighting (immune) system attacks itself (autoimmune disorders).  Being overweight or obese.  Having an inactive (sedentary) lifestyle.  Having been diagnosed with insulin resistance.  Having a history of prediabetes, gestational diabetes, or polycystic ovarian syndrome (PCOS).  Being of American-Indian, African-American, Hispanic/Latino, or Asian/Pacific Islander descent.  What are the signs or symptoms? Hyperglycemia may not cause any symptoms. If you do have symptoms, they may include early warning signs, such as:  Increased thirst.  Hunger.  Feeling very tired.  Needing to urinate more often than usual.  Blurry vision.  Other symptoms may develop if hyperglycemia gets worse, such as:  Dry mouth.  Loss of appetite.  Fruity-smelling breath.  Weakness.  Unexpected or rapid weight gain or weight loss.  Tingling or numbness in the hands or feet.  Headache.  Skin that does not quickly return to normal after being lightly pinched and released (poor skin turgor).  Abdominal pain.  Cuts or bruises that are slow to heal.  How is this diagnosed? Hyperglycemia is diagnosed with a blood test to measure your blood glucose level. This blood test is usually done while you are having symptoms. Your health care provider may also do a physical exam and review your medical history. You may have more tests to determine the cause of your hyperglycemia, such as:  A fasting blood glucose (FBG) test. You will not be allowed to eat (you will fast) for at least 8 hours before a blood sample is taken.  An A1c (hemoglobin A1c) blood test. This provides information about blood glucose control over the previous 2-3 months.  An oral glucose tolerance test (OGTT). This measures your blood glucose at two times: ? After fasting. This is your baseline blood glucose level. ? Two hours after drinking a  beverage that contains glucose.  How is this treated? Treatment depends on the cause of your hyperglycemia. Treatment may include:  Taking medicine to regulate your blood glucose levels. If you take insulin or other diabetes medicines, your medicine or dosage may be adjusted.  Lifestyle changes, such as exercising more, eating healthier foods, or losing weight.  Treating an illness or  infection, if this caused your hyperglycemia.  Checking your blood glucose more often.  Stopping or reducing steroid medicines, if these caused your hyperglycemia.  If your hyperglycemia becomes severe and it results in hyperglycemic hyperosmolar state, you must be hospitalized and given IV fluids. Follow these instructions at home: General instructions  Take over-the-counter and prescription medicines only as told by your health care provider.  Do not use any products that contain nicotine or tobacco, such as cigarettes and e-cigarettes. If you need help quitting, ask your health care provider.  Limit alcohol intake to no more than 1 drink per day for nonpregnant women and 2 drinks per day for men. One drink equals 12 oz of beer, 5 oz of wine, or 1 oz of hard liquor.  Learn to manage stress. If you need help with this, ask your health care provider.  Keep all follow-up visits as told by your health care provider. This is important. Eating and drinking  Maintain a healthy weight.  Exercise regularly, as directed by your health care provider.  Stay hydrated, especially when you exercise, get sick, or spend time in hot temperatures.  Eat healthy foods, such as: ? Lean proteins. ? Complex carbohydrates. ? Fresh fruits and vegetables. ? Low-fat dairy products. ? Healthy fats.  Drink enough fluid to keep your urine clear or pale yellow. If you have diabetes:   Make sure you know the symptoms of hyperglycemia.  Follow your diabetes management plan, as told by your health care provider. Make  sure you: ? Take your insulin and medicines as directed. ? Follow your exercise plan. ? Follow your meal plan. Eat on time, and do not skip meals. ? Check your blood glucose as often as directed. Make sure to check your blood glucose before and after exercise. If you exercise longer or in a different way than usual, check your blood glucose more often. ? Follow your sick day plan whenever you cannot eat or drink normally. Make this plan in advance with your health care provider.  Share your diabetes management plan with people in your workplace, school, and household.  Check your urine for ketones when you are ill and as told by your health care provider.  Carry a medical alert card or wear medical alert jewelry. Contact a health care provider if:  Your blood glucose is at or above 240 mg/dL (16.1 mmol/L) for 2 days in a row.  You have problems keeping your blood glucose in your target range.  You have frequent episodes of hyperglycemia. Get help right away if:  You have difficulty breathing.  You have a change in how you think, feel, or act (mental status).  You have nausea or vomiting that does not go away. These symptoms may represent a serious problem that is an emergency. Do not wait to see if the symptoms will go away. Get medical help right away. Call your local emergency services (911 in the [place] Do not drive yourself to the hospital. Summary  Hyperglycemia occurs when the level of sugar (glucose) in the blood is too high.  Hyperglycemia is diagnosed with a blood test to measure your blood glucose level. This blood test is usually done while you are having symptoms. Your health care provider may also do a physical exam and review your medical history.  If you have diabetes, follow your diabetes management plan as told by your health care provider.  Contact your health care provider if you have problems keeping your blood glucose in  your target range. This information is  not intended to replace advice given to you by your health care provider. Make sure you discuss any questions you have with your health care provider. Document Released: 09/12/2000 Document Revised: 12/05/2015 Document Reviewed: 12/05/2015 Elsevier Interactive Patient Education  [employer].

## 2017-03-16 NOTE — Progress Notes (Signed)
Subjective:  By signing my name below, I, Rhonda [name], attest that this documentation has been prepared under the direction and in the presence of Rhonda SorensonEva Shaw, MD. Electronically Signed: Stann Oresung-Kai [name], Scribe. 03/16/2017 , 4:41 PM .  Patient was seen in Room 1 .   Patient ID: Rhonda [name], female    DOB: 23-Sep-1965, 51 y.o.   MRN: [id number] Chief Complaint  Patient presents with  . Dysuria    x2 days, pt states she can't tell when has to use the bathroom and is experiencing pelvic pressure.  . Vaginal Discharge    pt states she is having some itching and inflammation.   HPI Rhonda [name] Eckhart is a 51 y.o. female who [facility] complaining of dysuria with vaginal discharge, itching and inflammation. Patient was seen in the ER for similar symptoms 3 months prior. [facility]. Her pelvic exam was completely normal as was her wet prep; however, her urinalysis was significant for glucosuria and CBG 357. At that time, patient stated she had a distant history of diabetes, previously requiring insulin, but had been controlled with lifestyle changes alone for 15 years. Patient declined empiric treatment for STD's so advised to follow up outpatient for further evaluation of her symptoms.   Patient states she was diagnosed with diabetes back in 2000s. She's been exercising since then with swimming, and had lost 50 lbs over the past 6 years. She's felt fatigue over the past week with dysuria, vaginal discharge and itching. She also mentions having improving back pain. She denies fever or chills.   Past Medical History:  Diagnosis Date  . Allergy   . Arthritis   . Bipolar disorder (HCC)    [facility]  . Diabetes mellitus   . Hip fracture (HCC)    left   Past Surgical History:  Procedure Laterality Date  . KNEE ARTHROSCOPY Right    Prior to Admission medications   Medication Sig Start Date End Date Taking? Authorizing Provider   aspirin 325 MG tablet Take 325 mg by mouth daily as needed for mild pain.    [provider]  Multiple Vitamins-Minerals (CENTRUM) tablet Take 1 tablet by mouth daily.      [provider]   Allergies  Allergen Reactions  . Penicillins Other (See Comments)    Pain / headache  . Merthiolate [Benzalkonium Chloride] Rash   Family History  Problem Relation Age of Onset  . Cancer Mother   . Diabetes Mother   . Diabetes Father   . Hyperlipidemia Brother   . Hypertension Brother    Social History   Socioeconomic History  . Marital status: Divorced    Spouse name: None  . Number of children: None  . Years of education: None  . Highest education level: None  Social Needs  . Financial resource strain: None  . Food insecurity - worry: None  . Food insecurity - inability: None  . Transportation needs - medical: None  . Transportation needs - non-medical: None  Occupational History  . Occupation: Advertising account plannernsurance Agent  Tobacco Use  . Smoking status: Former Games developermoker  . Smokeless tobacco: Never Used  Substance and Sexual Activity  . Alcohol use: No  . Drug use: No  . Sexual activity: None  Other Topics Concern  . None  Social History Narrative  . None   No flowsheet data found.  Review of Systems  Constitutional: Positive for fatigue. Negative for chills,  fever and unexpected weight change.  Respiratory: Negative for cough.   Gastrointestinal: Negative for constipation, diarrhea, nausea and vomiting.  Genitourinary: Positive for dysuria, pelvic pain and vaginal discharge.  Musculoskeletal: Positive for back pain.  Skin: Negative for rash and wound.  Neurological: Negative for dizziness, weakness and headaches.       Objective:   Physical Exam  Constitutional: She is oriented to person, place, and time. She appears well-developed and well-nourished. No distress.  HENT:  Head: Normocephalic and atraumatic.  Eyes: EOM are normal. Pupils are equal, round, and  reactive to light.  Neck: Neck supple. No thyromegaly present.  Cardiovascular: Regular rhythm. Tachycardia present.  Murmur heard.  Systolic (right upper sternal border) murmur is present with a grade of 2/6. Pulmonary/Chest: Effort normal and breath sounds normal. No respiratory distress. She has no wheezes.  Musculoskeletal: Normal range of motion.  Lymphadenopathy:    She has no cervical adenopathy.  Neurological: She is alert and oriented to person, place, and time.  Skin: Skin is warm and dry.  Psychiatric: She has a normal mood and affect. Her behavior is normal.  Nursing note and vitals reviewed.   BP 132/80 (BP Location: Left Arm, Patient Position: Sitting, Cuff Size: Normal)   Pulse (!) 108   Temp 98.6 F (37 C) (Oral)   Resp 18   Ht 5\' 6"  (1.676 m)   Wt 178 lb 3.2 oz (80.8 kg)   LMP 01/14/2017   SpO2 98%   BMI 28.76 kg/m   Results for orders placed or performed in visit on 03/16/17  POCT urinalysis dipstick  Result Value Ref Range   Color, UA yellow yellow   Clarity, UA cloudy (A) clear   Glucose, UA =500 (A) negative mg/dL   Bilirubin, UA negative negative   Ketones, POC UA moderate (40) (A) negative mg/dL   Spec Grav, UA <=9.604 (A) 1.010 - 1.025   Blood, UA moderate (A) negative   pH, UA 5.0 5.0 - 8.0   Protein Ur, POC negative negative mg/dL   Urobilinogen, UA 0.2 0.2 or 1.0 E.U./dL   Nitrite, UA Negative Negative   Leukocytes, UA Negative Negative  POCT Microscopic Urinalysis ([facility])  Result Value Ref Range   WBC,UR,HPF,POC Too numerous to count  (A) None WBC/hpf   RBC,UR,HPF,POC Moderate (A) None RBC/hpf   Bacteria Many (A) None, Too numerous to count   Mucus Absent Absent   Epithelial Cells, UR Per Microscopy Moderate (A) None, Too numerous to count cells/hpf  POCT Wet + KOH Prep  Result Value Ref Range   Yeast by KOH Present (A) Absent   Yeast by wet prep Present (A) Absent   WBC by wet prep Too numerous to count  (A) Few   Clue Cells Wet Prep  HPF POC Many (A) None   Trich by wet prep Absent Absent   Bacteria Wet Prep HPF POC Many (A) Few   Epithelial Cells By [employer] Pref ([facility]) None None, Few, Too numerous to count   RBC,UR,HPF,POC Moderate (A) None RBC/hpf  POCT glycosylated hemoglobin (Hb A1C)  Result Value Ref Range   Hemoglobin A1C >14.0   POCT glucose (manual entry)  Result Value Ref Range   POC Glucose >444 70 - 99 mg/dl       Assessment & Plan:   1. Pelvic pain in female   2. Uncontrolled type 2 diabetes mellitus with hyperglycemia (HCC)   Pt refuses any send out test as self-pay and also refuses pelvic exam. Advised  that she will keep getting repeat infections due to uncontrolled DM and severe glucosuria.  May have UTI as well as BV and vaginal candidiasis so if sxs do not improve after several days or worsen, call for rx for keflex. Pt notes weight loss due to exercise but advised likely exacerbated by uncontrolled DM - she wonders about restarting metformin which I am willing to rx her but advise that she will almost certainly need insulin at this point - counselled pt on otc insulin at [facility] for $25/vial which could be life-saving for her. Advised pt she is at high risk for permanent blindness, renal failure requiring dialysis, chronic pain and even amputation from neuropathy, etc and more urgently for severe sedation/seizures/coma requiring emergent hospitalization.  However, pt still declines rx for metformin or instruction on otc insulin use.  Orders Placed This Encounter  Procedures  . POCT urinalysis dipstick  . POCT Microscopic Urinalysis ([facility])  . POCT Wet + KOH Prep  . POCT glycosylated hemoglobin (Hb A1C)  . POCT glucose (manual entry)    Meds ordered this encounter  Medications  . metroNIDAZOLE (FLAGYL) 500 MG tablet    Sig: Take 1 tablet (500 mg total) by mouth 2 (two) times daily.    Dispense:  14 tablet    Refill:  0  . fluconazole (DIFLUCAN) 150 MG tablet    Sig: Take 1 tablet (150 mg total)  by mouth once for 1 dose. Repeat if needed    Dispense:  2 tablet    Refill:  0    I personally performed the services described in this documentation, which was scribed in my presence. The recorded information has been reviewed and considered, and addended by me as needed.   Rhonda SorensonEva [name], M.D.  [facility] at [facility]  [place] [address] [place], [place] [address] [phone] phone [phone] fax  03/19/17 10:26 AM

## 2017-03-18 ENCOUNTER — Telehealth: Payer: Self-pay

## 2017-03-18 MED ORDER — CEPHALEXIN 500 MG PO CAPS: 500.0000 mg | ORAL_CAPSULE | Freq: Three times a day (TID) | ORAL | 0 refills | Status: DC

## 2017-03-18 NOTE — Telephone Encounter (Signed)
Copied from CRM [id number]. Topic: Quick Communication - Rx Refill/Question >> Mar 18, 2017 11:53 AM Darletta MollLander, Lumin L wrote: Has the patient contacted their pharmacy? No. (inquiry only) (Agent: If no, request that the patient contact the pharmacy for the refill.) Preferred Pharmacy (with phone number or street name): [facility] [employer] [address] - [place], [place] - [address] Agent: Please be advised that RX refills may take up to 3 business days. We ask that you follow-up with your pharmacy. Dr. Norberto SorensonEva Shaw told patient you if the symptoms persist patient may need treatment for UTI. Patient is experiencing burning/pain when urinating. Please call her to let her know if the script will be sent.

## 2017-03-18 NOTE — Telephone Encounter (Signed)
Keflex sent to [facility] on [address].

## 2017-04-30 ENCOUNTER — Other Ambulatory Visit: Payer: Self-pay | Admitting: Family Medicine

## 2020-03-01 ENCOUNTER — Ambulatory Visit (HOSPITAL_COMMUNITY)
Admission: EM | Admit: 2020-03-01 | Discharge: 2020-03-01 | Disposition: A | Discharge status: Home / Self Care | Payer: 59 | Attending: Family Medicine | Admitting: Family Medicine

## 2020-03-01 ENCOUNTER — Encounter (HOSPITAL_COMMUNITY): Payer: Self-pay | Admitting: Emergency Medicine

## 2020-03-01 ENCOUNTER — Other Ambulatory Visit: Payer: Self-pay

## 2020-03-01 DIAGNOSIS — M79645 Pain in left finger(s): Secondary | ICD-10-CM | POA: Diagnosis not present

## 2020-03-01 DIAGNOSIS — L03012 Cellulitis of left finger: Secondary | ICD-10-CM

## 2020-03-01 MED ORDER — DOXYCYCLINE HYCLATE 100 MG PO CAPS: 100.0000 mg | ORAL_CAPSULE | Freq: Two times a day (BID) | ORAL | 0 refills | Status: DC

## 2020-03-01 MED ORDER — ACETAMINOPHEN 325 MG PO TABS
650.0000 mg | ORAL_TABLET | Freq: Once | ORAL | Status: AC
  Administered 2020-03-01: 650 mg via ORAL

## 2020-03-01 MED ORDER — ACETAMINOPHEN 325 MG PO TABS
ORAL_TABLET | ORAL | Status: AC
  Filled 2020-03-01: qty 2

## 2020-03-01 NOTE — ED Triage Notes (Signed)
Pt presents with right pointer finger pain and swelling xs 2 weeks.

## 2020-03-01 NOTE — Discharge Instructions (Signed)
Apply warm compresses to the area to allow draining  I sent in doxycycline for you to take twice a day for 7 days  If your symptoms are improving over the next 24 hours after you start your medication, continue your medication at home  If you notice red streaking up your arm over the next 24 hours, follow-up in the ER

## 2020-03-02 NOTE — ED Provider Notes (Signed)
[facility]   [id number] 03/01/20 Arrival Time: 1618  CC: RASH  SUBJECTIVE:  Rhonda [name] is a 54 y.o. female who presents with a skin complaint that began 2 weeks ago. Reports that she thinks that she cut her fingernail too short and that this caused the pain and swelling to the left middle finger. Reports that the area is painful, red, white, and swollen. There are no aggravating or alleviating factors. Reports that this has occurred in the past but that she has never had an episode that lasted this long. Denies fever, chills, nausea, vomiting, discharge, oral lesions, SOB, chest pain, abdominal pain, changes in bowel or bladder function.    ROS: As per HPI.  All other pertinent ROS negative.     Past Medical History:  Diagnosis Date  . Allergy   . Arthritis   . Bipolar disorder (HCC)    Dr. Noelle Penner in [facility]  . Diabetes mellitus   . Hip fracture (HCC)    left   Past Surgical History:  Procedure Laterality Date  . KNEE ARTHROSCOPY Right    Allergies  Allergen Reactions  . Penicillins Other (See Comments)    Pain / headache  . Merthiolate [Benzalkonium Chloride] Rash   No current facility-administered medications on file prior to encounter.   Current Outpatient Medications on File Prior to Encounter  Medication Sig Dispense Refill  . aspirin 325 MG tablet Take 325 mg by mouth daily as needed for mild pain.    . cephALEXin (KEFLEX) 500 MG capsule Take 1 capsule (500 mg total) by mouth 3 (three) times daily. 21 capsule 0  . metroNIDAZOLE (FLAGYL) 500 MG tablet Take 1 tablet (500 mg total) by mouth 2 (two) times daily. 14 tablet 0  . Multiple Vitamins-Minerals (CENTRUM) tablet Take 1 tablet by mouth daily.       Social History   Socioeconomic History  . Marital status: Divorced    Spouse name: Not on file  . Number of children: Not on file  . Years of education: Not on file  . Highest education level: Not on file  Occupational History  . Occupation:  Advertising account planner  Tobacco Use  . Smoking status: Former Games developer  . Smokeless tobacco: Never Used  Vaping Use  . Vaping Use: Never used  Substance and Sexual Activity  . Alcohol use: No  . Drug use: No  . Sexual activity: Not on file  Other Topics Concern  . Not on file  Social History Narrative  . Not on file   Social Determinants of Health   Financial Resource Strain:   . Difficulty of Paying Living Expenses: Not on file  Food Insecurity:   . Worried About Programme researcher, broadcasting/film/video in the Last Year: Not on file  . Ran Out of Food in the Last Year: Not on file  Transportation Needs:   . Lack of Transportation (Medical): Not on file  . Lack of Transportation (Non-Medical): Not on file  Physical Activity:   . Days of Exercise per Week: Not on file  . Minutes of Exercise per Session: Not on file  Stress:   . Feeling of Stress : Not on file  Social Connections:   . Frequency of Communication with Friends and Family: Not on file  . Frequency of Social Gatherings with Friends and Family: Not on file  . Attends Religious Services: Not on file  . Active Member of Clubs or Organizations: Not on file  . Attends Banker Meetings:  Not on file  . Marital Status: Not on file  Intimate Partner Violence:   . Fear of Current or Ex-Partner: Not on file  . Emotionally Abused: Not on file  . Physically Abused: Not on file  . Sexually Abused: Not on file   Family History  Problem Relation Age of Onset  . Cancer Mother   . Diabetes Mother   . Diabetes Father   . Hyperlipidemia Brother   . Hypertension Brother     OBJECTIVE: Vitals:   03/01/20 1750  BP: (!) 151/87  Pulse: 95  Resp: 17  Temp: 98.4 F (36.9 C)  SpO2: 100%    General appearance: alert; no distress Head: NCAT Lungs: clear to auscultation bilaterally Heart: regular rate and rhythm.  Radial pulse 2+ bilaterally Extremities: no edema Skin: warm and dry; tip of L middle finger erythematous, swollen with  obvious pus pocket to medial aspect of the nail Psychological: alert and cooperative; normal mood and affect  ASSESSMENT & PLAN:  1. Paronychia of finger, left   2. Finger pain, left     Meds ordered this encounter  Medications  . acetaminophen (TYLENOL) tablet 650 mg  . doxycycline (VIBRAMYCIN) 100 MG capsule    Sig: Take 1 capsule (100 mg total) by mouth 2 (two) times daily.    Dispense:  14 capsule    Refill:  0    Order Specific Question:   Supervising Provider    Answer:   Merrilee Jansky [[id number]]    Prescribed doxycycline  Tylenol given in office Incision and drainage performed with small amount purulent drainage from the area Procedure stopped due to patient discomfort Take as prescribed and to completion Avoid hot showers/ baths Moisturize skin daily  Follow up with PCP if symptoms persists Return or go to the ER if you have any new or worsening symptoms such as fever, chills, nausea, vomiting, redness, swelling, discharge, if symptoms do not improve with medications  Reviewed expectations re: course of current medical issues. Questions answered. Outlined signs and symptoms indicating need for more acute intervention. Patient verbalized understanding. After Visit Summary given.   Moshe Cipro, NP 03/02/20 1017

## 2020-03-03 ENCOUNTER — Encounter (HOSPITAL_COMMUNITY): Payer: Self-pay | Admitting: Emergency Medicine

## 2020-03-03 ENCOUNTER — Emergency Department (HOSPITAL_COMMUNITY)
Admission: EM | Admit: 2020-03-03 | Discharge: 2020-03-03 | Disposition: A | Discharge status: Home / Self Care | Payer: 59 | Attending: Emergency Medicine | Admitting: Emergency Medicine

## 2020-03-03 ENCOUNTER — Other Ambulatory Visit: Payer: Self-pay

## 2020-03-03 DIAGNOSIS — E119 Type 2 diabetes mellitus without complications: Secondary | ICD-10-CM | POA: Insufficient documentation

## 2020-03-03 DIAGNOSIS — Z96651 Presence of right artificial knee joint: Secondary | ICD-10-CM | POA: Insufficient documentation

## 2020-03-03 DIAGNOSIS — Z7982 Long term (current) use of aspirin: Secondary | ICD-10-CM | POA: Diagnosis not present

## 2020-03-03 DIAGNOSIS — L02512 Cutaneous abscess of left hand: Secondary | ICD-10-CM | POA: Diagnosis present

## 2020-03-03 DIAGNOSIS — B9689 Other specified bacterial agents as the cause of diseases classified elsewhere: Secondary | ICD-10-CM | POA: Insufficient documentation

## 2020-03-03 DIAGNOSIS — L03012 Cellulitis of left finger: Secondary | ICD-10-CM

## 2020-03-03 DIAGNOSIS — Z87891 Personal history of nicotine dependence: Secondary | ICD-10-CM | POA: Diagnosis not present

## 2020-03-03 LAB — CBC
HCT: 40.4 % (ref 36.0–46.0)
Hemoglobin: 13.1 g/dL (ref 12.0–15.0)
MCH: 28.1 pg (ref 26.0–34.0)
MCHC: 32.4 g/dL (ref 30.0–36.0)
MCV: 86.5 fL (ref 80.0–100.0)
Platelets: 310 10*3/uL (ref 150–400)
RBC: 4.67 MIL/uL (ref 3.87–5.11)
RDW: 12.4 % (ref 11.5–15.5)
WBC: 6.8 10*3/uL (ref 4.0–10.5)
nRBC: 0 % (ref 0.0–0.2)

## 2020-03-03 LAB — BASIC METABOLIC PANEL
Anion gap: 10 (ref 5–15)
BUN: 11 mg/dL (ref 6–20)
CO2: 26 mmol/L (ref 22–32)
Calcium: 9.5 mg/dL (ref 8.9–10.3)
Chloride: 97 mmol/L — ABNORMAL LOW (ref 98–111)
Creatinine, Ser: 0.56 mg/dL (ref 0.44–1.00)
GFR, Estimated: >60 mL/min (ref >60)
Glucose, Bld: 335 mg/dL — ABNORMAL HIGH (ref 70–99)
Potassium: 3.9 mmol/L (ref 3.5–5.1)
Sodium: 133 mmol/L — ABNORMAL LOW (ref 135–145)

## 2020-03-03 LAB — CBG MONITORING, ED: Glucose-Capillary: 316 mg/dL — ABNORMAL HIGH (ref 70–99)

## 2020-03-03 MED ORDER — LIDOCAINE HCL (PF) 1 % IJ SOLN
30.0000 mL | Freq: Once | INTRAMUSCULAR | Status: DC
  Filled 2020-03-03: qty 30

## 2020-03-03 MED ORDER — ACETAMINOPHEN-CODEINE #3 300-30 MG PO TABS: 1.0000 | ORAL_TABLET | Freq: Four times a day (QID) | ORAL | 0 refills | Status: DC | PRN

## 2020-03-03 NOTE — Discharge Instructions (Signed)
WOUND CARE   You may change your bandage if it is bleeding and gets soaked with blood, otherwise leave the bandage in place until you see Dr. Merlyn Lot   Do not apply any ointments or creams to the wound while packing is in place, as this may cause delayed healing.  Seek medical careif you experience any of the following signs of infection: Swelling, redness, pus drainage, streaking, fever >101.0 F  Seek care if you experience excessive bleeding that does not stop after 15-20 minutes of constant, firm pressure.

## 2020-03-03 NOTE — ED Triage Notes (Signed)
Patient arrives to ED to follow up for her finger infection. Pt dx with paronychia on left finger at [facility] on 11/30. Pt prescribed doxycyline, finger has been getting better.

## 2020-03-03 NOTE — ED Provider Notes (Signed)
[facility] EMERGENCY DEPARTMENT Provider Note   CSN: [id number] Arrival date & time: 03/03/20  1130     History Chief Complaint  Patient presents with  . Finger Infection    Rhonda [name] is a 54 y.o. female   who has a pmh of diet controlled DM  Who presents with scc of finger infection. She was seen  At [facility] on 03/01/20 for a paronychia. She had an I&d and was sent home on doxycycline. She states that her pain is better, but she has  Worsening accumulation of pus in the finger. She stuck a needle in the pad of her finger and was able to get out some pus. She denies fever or chills. She is right hand dominant.  HPI     Past Medical History:  Diagnosis Date  . Allergy   . Arthritis   . Bipolar disorder (HCC)    [facility]. Noelle [facility] in [facility]  . Diabetes mellitus   . Hip fracture (HCC)    left    There are no problems to display for this patient.   Past Surgical History:  Procedure Laterality Date  . KNEE ARTHROSCOPY Right      OB History   No obstetric history on file.     Family History  Problem Relation Age of Onset  . Cancer Mother   . Diabetes Mother   . Diabetes Father   . Hyperlipidemia Brother   . Hypertension Brother     Social History   Tobacco Use  . Smoking status: Former Games developer  . Smokeless tobacco: Never Used  Vaping Use  . Vaping Use: Never used  Substance Use Topics  . Alcohol use: No  . Drug use: No    Home Medications Prior to Admission medications   Medication Sig Start Date End Date Taking? Authorizing Provider  aspirin 325 MG tablet Take 325 mg by mouth daily as needed for mild pain.    [provider]  cephALEXin (KEFLEX) 500 MG capsule Take 1 capsule (500 mg total) by mouth 3 (three) times daily. 03/18/17   Sherren Mocha, MD  doxycycline (VIBRAMYCIN) 100 MG capsule Take 1 capsule (100 mg total) by mouth 2 (two) times daily. 03/01/20   Moshe Cipro, NP  metroNIDAZOLE (FLAGYL) 500 MG tablet Take 1 tablet  (500 mg total) by mouth 2 (two) times daily. 03/16/17   Sherren Mocha, MD  Multiple Vitamins-Minerals (CENTRUM) tablet Take 1 tablet by mouth daily.      [provider]    Allergies    Penicillins and Merthiolate [benzalkonium chloride]  Review of Systems   Review of Systems Ten systems reviewed and are negative for acute change, except as noted in the HPI.   Physical Exam Updated Vital Signs BP (!) 167/92 (BP Location: Right Arm)   Pulse 95   Temp 98 F (36.7 C) (Oral)   Resp 16   Ht 5\' 6"  (1.676 m)   Wt 63.5 kg   SpO2 100%   BMI 22.60 kg/m   Physical Exam Vitals and nursing note reviewed.  Constitutional:      General: She is not in acute distress.    Appearance: She is well-developed. She is not diaphoretic.  HENT:     Head: Normocephalic and atraumatic.  Eyes:     General: No scleral icterus.    Conjunctiva/sclera: Conjunctivae normal.  Cardiovascular:     Rate and Rhythm: Normal rate and regular rhythm.     Heart sounds: Normal  heart sounds. No murmur heard.  No friction rub. No gallop.   Pulmonary:     Effort: Pulmonary effort is normal. No respiratory distress.     Breath sounds: Normal breath sounds.  Abdominal:     General: Bowel sounds are normal. There is no distension.     Palpations: Abdomen is soft. There is no mass.     Tenderness: There is no abdominal tenderness. There is no guarding.  Musculoskeletal:     Cervical back: Normal range of motion.     Comments: LUE exam Inspection- Left index finger with bulbous swelling of the distal tip with obvious purulence along the proximal and radial nail margin extending into the finger pad. Palpation- TTP ROM- Full ROM about wrist and fingers Strength- 5/5 AIN/PIN/U NV- SILT M/R/U, +2 Radial    Skin:    General: Skin is warm and dry.  Neurological:     Mental Status: She is alert and oriented to person, place, and time.  Psychiatric:        Behavior: Behavior normal.              ED  Results / Procedures / Treatments   Labs (all labs ordered are listed, but only abnormal results are displayed) Labs Reviewed  CBC  BASIC METABOLIC PANEL  CBG MONITORING, ED    EKG None  Radiology No results found.  Procedures .Marland KitchenIncision and Drainage  Date/Time: 03/03/2020 4:48 PM Performed by: Arthor Captain, PA-C Authorized by: Arthor Captain, PA-C   Consent:    Consent obtained:  Verbal   Consent given by:  Patient   Risks discussed:  Bleeding, incomplete drainage, pain and damage to other organs   Alternatives discussed:  No treatment Universal protocol:    Procedure explained and questions answered to patient or proxy's satisfaction: yes     Relevant documents present and verified: yes     Test results available and properly labeled: yes     Imaging studies available: yes     Required blood products, implants, devices, and special equipment available: yes     Site/side marked: yes     Immediately prior to procedure a time out was called: yes     Patient identity confirmed:  Verbally with patient Location:    Type:  Abscess Pre-procedure details:    Skin preparation:  Betadine Anesthesia (see MAR for exact dosages):    Anesthesia method:  Local infiltration   Local anesthetic:  Lidocaine 1% WITH epi Procedure type:    Complexity:  Complex Procedure details:    Incision types:  Single straight   Incision depth:  Subcutaneous   Scalpel blade:  11   Wound management:  Probed and deloculated, irrigated with saline and extensive cleaning   Drainage:  Purulent   Drainage amount:  Moderate   Packing materials:  1/4 in gauze Post-procedure details:    Patient tolerance of procedure:  Tolerated well, no immediate complications   (including critical care time)  Medications Ordered in ED Medications - No data to display  ED Course  I have reviewed the triage vital signs and the nursing notes.  Pertinent labs & imaging results that were available during my  care of the patient were reviewed by me and considered in my medical decision making (see chart for details).    MDM Rules/Calculators/A&P                          54 year old female here with  complaint of finger infection.  She is status post I&D and currently on doxycycline.  History of diabetes.  I ordered and reviewed labs which included a BMP shows a glucose of 335, CBC without elevated white blood count I consulted with Charma Igo, PA.  He discussed the case with Dr. Merlyn Lot who agrees that we can drain the abscess here in the emergency department asks for packing and to have her follow closely in the office in the next 2 days.  Patient should remain on doxycycline.  As she was given a short course of Tylenol 3 after reviewing the PDMP review. Discussed need for PCP follow up regarding her diabetes.  Discussed wound care and return precautions. Final Clinical Impression(s) / ED Diagnoses Final diagnoses:  Felon of finger of left hand    Rx / DC Orders ED Discharge Orders    None       Arthor Captain, PA-C 03/03/20 Ileene Hutchinson, MD 03/06/20 [phone]

## 2020-03-03 NOTE — ED Notes (Signed)
Pt discharge instructions reviewed with the patient. The patient verbalized understanding of instructions. Pt discharged. 

## 2020-03-03 NOTE — Consult Note (Signed)
Reason for Consult:Left index finger felon Referring Physician: Clelia Schaumann  Rhonda [name] is an 54 y.o. female.  HPI: Rhonda [name] comes in with a left index finger infection. She had seen UC about it on 11/30 and had it drained. It feels better but has swollen up again and she also has fingerpad involvement as well. She's been able to express purulence from around nail and from pad. She is RHD and works as a Lawyer.  Past Medical History:  Diagnosis Date  . Allergy   . Arthritis   . Bipolar disorder (HCC)    Dr. Noelle Penner in [facility]  . Diabetes mellitus   . Hip fracture (HCC)    left    Past Surgical History:  Procedure Laterality Date  . KNEE ARTHROSCOPY Right     Family History  Problem Relation Age of Onset  . Cancer Mother   . Diabetes Mother   . Diabetes Father   . Hyperlipidemia Brother   . Hypertension Brother     Social History:  reports that she has quit smoking. She has never used smokeless tobacco. She reports that she does not drink alcohol and does not use drugs.  Allergies:  Allergies  Allergen Reactions  . Penicillins Other (See Comments)    Pain / headache  . Merthiolate [Benzalkonium Chloride] Rash    Medications: I have reviewed the patient's current medications.  No results found for this or any previous visit (from the past 48 hour(s)).  No results found.  Review of Systems  Constitutional: Negative for chills, diaphoresis and fever.  HENT: Negative for ear discharge, ear pain, hearing loss and tinnitus.   Eyes: Negative for photophobia and pain.  Respiratory: Negative for cough and shortness of breath.   Cardiovascular: Negative for chest pain.  Gastrointestinal: Negative for abdominal pain, nausea and vomiting.  Genitourinary: Negative for dysuria, flank pain, frequency and urgency.  Musculoskeletal: Positive for arthralgias (Left index finger). Negative for back pain, myalgias and neck pain.  Neurological: Negative for dizziness and  headaches.  Hematological: Does not bruise/bleed easily.  Psychiatric/Behavioral: The patient is not nervous/anxious.    Blood pressure (!) 167/92, pulse 95, temperature 98 F (36.7 C), temperature source Oral, resp. rate 16, height 5\' 6"  (1.676 m), weight 63.5 kg, SpO2 100 %. Physical Exam Constitutional:      General: She is not in acute distress.    Appearance: She is well-developed. She is not diaphoretic.  HENT:     Head: Normocephalic and atraumatic.  Eyes:     General: No scleral icterus.       Right eye: No discharge.        Left eye: No discharge.     Conjunctiva/sclera: Conjunctivae normal.  Cardiovascular:     Rate and Rhythm: Normal rate and regular rhythm.  Pulmonary:     Effort: Pulmonary effort is normal. No respiratory distress.  Musculoskeletal:     Cervical back: Normal range of motion.     Comments: Left shoulder, elbow, wrist, digits- Index finger paronychia w/pad involvement, obvious purulence, severe TTP, no instability, no blocks to motion  Sens  Ax/R/M/U intact  Mot   Ax/ R/ PIN/ M/ AIN/ U intact  Rad 2+  Skin:    General: Skin is warm and dry.  Neurological:     Mental Status: She is alert.  Psychiatric:        Behavior: Behavior normal.     Assessment/Plan: Left index finger felon -- Given no systemic s/sx of infection  could likely safely I&D in ED, which is pt's preference. She should f/u with Dr. Merlyn Lot early next week.     Freeman Caldron, PA-C Orthopedic Surgery [phone] 03/03/2020, 1:57 PM

## 2020-03-03 NOTE — ED Notes (Signed)
Lidocaine and suture cart place at bedside and provide aware pt is ready  Pt does not appear in distress, respirations are even and non-labored  Skin is warm, dry and intact

## 2020-03-04 ENCOUNTER — Telehealth: Payer: Self-pay | Admitting: *Deleted

## 2020-03-04 NOTE — Telephone Encounter (Signed)
TOC CM received call from pt states she is suppose to have her finger checked in 2 days, on Sat. Had concerns as her appt for physician is not until Monday. Explained to pt if she is experiencing fever, pain, swelling, redness or drainage to seek medical attention. States she has not unwrapped wound and feels more comfortable following up at Urgent Care or ED on tomorrow. Isidoro Donning RN CCM, [facility] ED TOC CM [phone]

## 2020-03-05 ENCOUNTER — Other Ambulatory Visit: Payer: Self-pay

## 2020-03-05 ENCOUNTER — Ambulatory Visit (HOSPITAL_COMMUNITY)
Admission: EM | Admit: 2020-03-05 | Discharge: 2020-03-05 | Disposition: A | Discharge status: Home / Self Care | Payer: 59

## 2020-03-05 ENCOUNTER — Encounter (HOSPITAL_COMMUNITY): Payer: Self-pay | Admitting: *Deleted

## 2020-03-05 DIAGNOSIS — L03019 Cellulitis of unspecified finger: Secondary | ICD-10-CM

## 2020-03-05 NOTE — ED Provider Notes (Signed)
[facility]    CSN: [id number] Arrival date & time: 03/05/20  1029      History   Chief Complaint Chief Complaint  Patient presents with  . Finger Injury    LT    HPI Rhonda [name] is a 54 y.o. female.   Patient presenting today for wound check of left index finger. Was seen for a paronychia 5 days ago and was placed on antibiotics. She then presented to the ED 2 days later for worsening pain and swelling and progression in infection. Finger was drained, packed and she was to f/u with Hand Surgeon Friday but had an insurance barrier that prevented this appt. She does have an appt set for Monday but was told to come have the wound rechecked over the weekend. She feels her pain is mildly improved by having numbness, itching and tingling in the finger. Taking tylenol 3's for pain relief with some benefit. Denies fever, chills, sweats, body aches, streaking discoloration into hand. Has been compliant with abx and kept dressing intact since ED visit.        Past Medical History:  Diagnosis Date  . Allergy   . Arthritis   . Bipolar disorder (HCC)    Dr. Noelle Penner in [facility]  . Diabetes mellitus   . Hip fracture (HCC)    left    There are no problems to display for this patient.   Past Surgical History:  Procedure Laterality Date  . KNEE ARTHROSCOPY Right     OB History   No obstetric history on file.      Home Medications    Prior to Admission medications   Medication Sig Start Date End Date Taking? Authorizing Provider  acetaminophen-codeine (TYLENOL #3) 300-30 MG tablet Take 1-2 tablets by mouth every 6 (six) hours as needed for moderate pain. 03/03/20  Yes Harris, Abigail, PA-C  doxycycline (VIBRAMYCIN) 100 MG capsule Take 1 capsule (100 mg total) by mouth 2 (two) times daily. 03/01/20  Yes Moshe Cipro, NP  Multiple Vitamins-Minerals (CENTRUM) tablet Take 1 tablet by mouth daily.     Yes [provider]  aspirin 325 MG tablet Take 325 mg  by mouth daily as needed for mild pain.    [provider]  cephALEXin (KEFLEX) 500 MG capsule Take 1 capsule (500 mg total) by mouth 3 (three) times daily. 03/18/17   Sherren Mocha, MD  metroNIDAZOLE (FLAGYL) 500 MG tablet Take 1 tablet (500 mg total) by mouth 2 (two) times daily. 03/16/17   Sherren Mocha, MD    Family History Family History  Problem Relation Age of Onset  . Cancer Mother   . Diabetes Mother   . Diabetes Father   . Hyperlipidemia Brother   . Hypertension Brother     Social History Social History   Tobacco Use  . Smoking status: Former Games developer  . Smokeless tobacco: Never Used  Vaping Use  . Vaping Use: Never used  Substance Use Topics  . Alcohol use: No  . Drug use: No     Allergies   Penicillins and Merthiolate [benzalkonium chloride]   Review of Systems Review of Systems PER HPI    Physical Exam Triage Vital Signs ED Triage Vitals  Enc Vitals Group     BP 03/05/20 1230 126/84     Pulse Rate 03/05/20 1230 98     Resp 03/05/20 1230 18     Temp 03/05/20 1230 98.6 F (37 C)     Temp Source 03/05/20  1230 Oral     SpO2 03/05/20 1230 99 %     Weight 03/05/20 1154 139 lb 15.9 oz (63.5 kg)     Height 03/05/20 1154 5\' 6"  (1.676 m)     Head Circumference --      Peak Flow --      Pain Score 03/05/20 1153 5     Pain Loc --      Pain Edu? --      Excl. in GC? --    No data found.  Updated Vital Signs BP 126/84   Pulse 98   Temp 98.6 F (37 C) (Oral)   Resp 18   Ht 5\' 6"  (1.676 m)   Wt 139 lb 15.9 oz (63.5 kg)   SpO2 99%   BMI 22.60 kg/m   Visual Acuity Right Eye Distance:   Left Eye Distance:   Bilateral Distance:    Right Eye Near:   Left Eye Near:    Bilateral Near:     Physical Exam Vitals and nursing note reviewed.  Constitutional:      Appearance: Normal appearance. She is not ill-appearing.  HENT:     Head: Atraumatic.  Eyes:     Extraocular Movements: Extraocular movements intact.     Conjunctiva/sclera:  Conjunctivae normal.  Cardiovascular:     Rate and Rhythm: Normal rate and regular rhythm.     Heart sounds: Normal heart sounds.     Comments: Sluggish cap refill right distal index finger Pulmonary:     Effort: Pulmonary effort is normal.     Breath sounds: Normal breath sounds.  Musculoskeletal:        General: Tenderness (severe ttp distal right index finger) present.     Cervical back: Normal range of motion and neck supple.     Comments: Good ROM of right hand aside from right index finger from PIP and distally  Skin:    Comments: Tip of right index finger with pallor and coolness, increased edema diffusely when compared to pictures from ED 2 days ago Wound soaked in sterile saline and packing removed. No purulent drainage from wound bed  Neurological:     Mental Status: She is alert and oriented to person, place, and time.  Psychiatric:        Mood and Affect: Mood normal.        Thought Content: Thought content normal.        Judgment: Judgment normal.              UC Treatments / Results  Labs (all labs ordered are listed, but only abnormal results are displayed) Labs Reviewed - No data to display  EKG   Radiology No results found.  Procedures Procedures (including critical care time)  Medications Ordered in UC Medications - No data to display  Initial Impression / Assessment and Plan / UC Course  I have reviewed the triage vital signs and the nursing notes.  Pertinent labs & imaging results that were available during my care of the patient were reviewed by me and considered in my medical decision making (see chart for details).     Findings today concerning of progressive infection in finger despite abx and compliance with wound dressing. Discussed my concerns regarding perfusion to fingertip given extent and strongly urged her to present to the ED for immediate re-evaluation and consultation. She feels it is improving and that she would like to wait  until her scheduled appt Monday. Again reiterated possible consequences of  waiting, patient expresses understanding of these risks and states she will consider going to ED tomorrow if worsening. Dressing replaced with packing and nonstick gauze, coban wrap. Continue abx.  Final Clinical Impressions(s) / UC Diagnoses   Final diagnoses:  Felon of finger     Discharge Instructions     I strongly recommend that you go to the ER due to the worsening condition of your finger infection to avoid possible long term effects of the infection.     ED Prescriptions    None     PDMP not reviewed this encounter.   Roosvelt Maser [place], [place] 03/06/20 [phone]

## 2020-03-05 NOTE — Discharge Instructions (Signed)
I strongly recommend that you go to the ER due to the worsening condition of your finger infection to avoid possible long term effects of the infection.

## 2020-03-05 NOTE — ED Triage Notes (Addendum)
Pt last seen in ED on 12-2 for finger infection on 11/30. Pt has an appt on Monday with Hand Doctor but feels the Lt index finger is worse and wants it checked.

## 2020-03-05 NOTE — ED Provider Notes (Incomplete)
[facility]    CSN: [id number] Arrival date & time: 03/05/20  1029      History   Chief Complaint Chief Complaint  Patient presents with  . Finger Injury    LT    HPI Rhonda [name] is a 54 y.o. female.   Patient presenting today for wound check of left index finger. Was seen for a paronychia 5 days ago and was placed on antibiotics. She then presented to the ED 2 days later for worsening pain and swelling and progression in infection. Finger was drained, packed and she was to f/u with Hand Surgeon Friday but had an insurance barrier that prevented this appt. She does have an appt set for Monday but was told to come have the wound rechecked over the weekend. She feels her pain is mildly improved by having numbness, itching and tingling in the finger. Taking tylenol 3's for pain relief with some benefit. Denies fever, chills, sweats, body aches, streaking discoloration into hand. Has been compliant with abx and kept dressing intact since ED visit.       Past Medical History:  Diagnosis Date  . Allergy   . Arthritis   . Bipolar disorder (HCC)    Dr. Noelle Penner in [facility]  . Diabetes mellitus   . Hip fracture (HCC)    left    There are no problems to display for this patient.   Past Surgical History:  Procedure Laterality Date  . KNEE ARTHROSCOPY Right     OB History   No obstetric history on file.      Home Medications    Prior to Admission medications   Medication Sig Start Date End Date Taking? Authorizing Provider  acetaminophen-codeine (TYLENOL #3) 300-30 MG tablet Take 1-2 tablets by mouth every 6 (six) hours as needed for moderate pain. 03/03/20  Yes Harris, Abigail, PA-C  doxycycline (VIBRAMYCIN) 100 MG capsule Take 1 capsule (100 mg total) by mouth 2 (two) times daily. 03/01/20  Yes Moshe Cipro, NP  Multiple Vitamins-Minerals (CENTRUM) tablet Take 1 tablet by mouth daily.     Yes [provider]  aspirin 325 MG tablet Take 325 mg by  mouth daily as needed for mild pain.    [provider]  cephALEXin (KEFLEX) 500 MG capsule Take 1 capsule (500 mg total) by mouth 3 (three) times daily. 03/18/17   Sherren Mocha, MD  metroNIDAZOLE (FLAGYL) 500 MG tablet Take 1 tablet (500 mg total) by mouth 2 (two) times daily. 03/16/17   Sherren Mocha, MD    Family History Family History  Problem Relation Age of Onset  . Cancer Mother   . Diabetes Mother   . Diabetes Father   . Hyperlipidemia Brother   . Hypertension Brother     Social History Social History   Tobacco Use  . Smoking status: Former Games developer  . Smokeless tobacco: Never Used  Vaping Use  . Vaping Use: Never used  Substance Use Topics  . Alcohol use: No  . Drug use: No     Allergies   Penicillins and Merthiolate [benzalkonium chloride]   Review of Systems Review of Systems PER HPI    Physical Exam Triage Vital Signs ED Triage Vitals  Enc Vitals Group     BP 03/05/20 1230 126/84     Pulse Rate 03/05/20 1230 98     Resp 03/05/20 1230 18     Temp 03/05/20 1230 98.6 F (37 C)     Temp Source 03/05/20 1230  Oral     SpO2 03/05/20 1230 99 %     Weight 03/05/20 1154 139 lb 15.9 oz (63.5 kg)     Height 03/05/20 1154 5\' 6"  (1.676 m)     Head Circumference --      Peak Flow --      Pain Score 03/05/20 1153 5     Pain Loc --      Pain Edu? --      Excl. in GC? --    No data found.  Updated Vital Signs BP 126/84   Pulse 98   Temp 98.6 F (37 C) (Oral)   Resp 18   Ht 5\' 6"  (1.676 m)   Wt 139 lb 15.9 oz (63.5 kg)   SpO2 99%   BMI 22.60 kg/m   Visual Acuity Right Eye Distance:   Left Eye Distance:   Bilateral Distance:    Right Eye Near:   Left Eye Near:    Bilateral Near:     Physical Exam Vitals and nursing note reviewed.  Constitutional:      Appearance: Normal appearance. She is not ill-appearing.  HENT:     Head: Atraumatic.  Eyes:     Extraocular Movements: Extraocular movements intact.     Conjunctiva/sclera:  Conjunctivae normal.  Cardiovascular:     Rate and Rhythm: Normal rate and regular rhythm.     Heart sounds: Normal heart sounds.  Pulmonary:     Effort: Pulmonary effort is normal.     Breath sounds: Normal breath sounds.  Musculoskeletal:     Cervical back: Normal range of motion and neck supple.  Skin:    General: Skin is warm.  Neurological:     Mental Status: She is alert and oriented to person, place, and time.  Psychiatric:        Mood and Affect: Mood normal.        Thought Content: Thought content normal.        Judgment: Judgment normal.              UC Treatments / Results  Labs (all labs ordered are listed, but only abnormal results are displayed) Labs Reviewed - No data to display  EKG   Radiology No results found.  Procedures Procedures (including critical care time)  Medications Ordered in UC Medications - No data to display  Initial Impression / Assessment and Plan / UC Course  I have reviewed the triage vital signs and the nursing notes.  Pertinent labs & imaging results that were available during my care of the patient were reviewed by me and considered in my medical decision making (see chart for details).     *** Final Clinical Impressions(s) / UC Diagnoses   Final diagnoses:  None   Discharge Instructions   None    ED Prescriptions    None     PDMP not reviewed this encounter.

## 2020-03-08 ENCOUNTER — Other Ambulatory Visit: Payer: Self-pay

## 2020-03-08 ENCOUNTER — Telehealth: Payer: Self-pay | Admitting: Surgery

## 2020-03-08 ENCOUNTER — Other Ambulatory Visit (HOSPITAL_COMMUNITY)
Admission: RE | Admit: 2020-03-08 | Discharge: 2020-03-08 | Disposition: A | Discharge status: Home / Self Care | Payer: 59 | Source: Ambulatory Visit | Attending: Orthopedic Surgery | Admitting: Orthopedic Surgery

## 2020-03-08 ENCOUNTER — Encounter (HOSPITAL_COMMUNITY): Payer: Self-pay | Admitting: Orthopedic Surgery

## 2020-03-08 DIAGNOSIS — Z20822 Contact with and (suspected) exposure to covid-19: Secondary | ICD-10-CM | POA: Diagnosis not present

## 2020-03-08 DIAGNOSIS — Z01812 Encounter for preprocedural laboratory examination: Secondary | ICD-10-CM | POA: Diagnosis present

## 2020-03-08 DIAGNOSIS — L03012 Cellulitis of left finger: Secondary | ICD-10-CM

## 2020-03-08 LAB — SARS CORONAVIRUS 2 (TAT 6-24 HRS): SARS Coronavirus 2: NEGATIVE

## 2020-03-08 NOTE — Telephone Encounter (Signed)
Addendum: 03/07/2020 1932 Beecher Mcardle RN BSN CM ED CM received call from patient concerning assistance with Hand Surgeon referral. Patient was told that her insurance was not accepted at Dr Merlyn Lot with [facility]. Patient given information  for [facility] suggested she contact the office to find out if her insurance is accepted at their office. Patient verbalized appreciation

## 2020-03-08 NOTE — H&P (Signed)
Rhonda [name] is an 54 y.o. female.   Chief Complaint: LEFT INDEX FINGER PAIN  HPI:  The patient is a 54 year old right-hand dominant female who was trimming her fingernails approximately 3 weeks ago and cut her left index finger nail too short causing swelling and discomfort.  She was seen in the urgent care for initial treatment where the finger was lanced and drained.  She was started on doxycycline.  She was seen in the emergency department the following week where a repeat incision and drainage was completed. She was evaluated in our office.  The finger continues to be swollen and have purulent drainage.  Discussed the reason and rationale for surgical intervention. She is here today for surgery. She denies chest pain, shortness of breath, fever, chills, nausea, vomiting, diarrhea.   Past Medical History:  Diagnosis Date  . Allergy   . Arthritis   . Bipolar disorder (HCC)    Dr. Noelle Penner in [facility]  . Diabetes mellitus   . Hip fracture (HCC)    left    Past Surgical History:  Procedure Laterality Date  . KNEE ARTHROSCOPY Right     Family History  Problem Relation Age of Onset  . Cancer Mother   . Diabetes Mother   . Diabetes Father   . Hyperlipidemia Brother   . Hypertension Brother    Social History:  reports that she has quit smoking. She has never used smokeless tobacco. She reports that she does not drink alcohol and does not use drugs.  Allergies:  Allergies  Allergen Reactions  . Penicillins Other (See Comments)    Pain / headache  . Merthiolate [Benzalkonium Chloride] Rash    No medications prior to admission.    No results found for this or any previous visit (from the past 48 hour(s)). No results found.  ROS NO RECENT ILLNESSES OR HOSPITALIZATIONS  There were no vitals taken for this visit. Physical Exam  General Appearance:  Alert, cooperative, no distress, appears stated age  Head:  Normocephalic, without obvious abnormality, atraumatic  Eyes:   Pupils equal, conjunctiva/corneas clear,         Throat: Lips, mucosa, and tongue normal; teeth and gums normal  Neck: No visible masses     Lungs:   respirations unlabored  Chest Wall:  No tenderness or deformity  Heart:  Regular rate and rhythm,  Abdomen:   Soft, non-tender,         Extremities: LUE - MODERATE SWELLING OF THE INDEX FINGER DISTAL PHALANX WITH PURULENT DRAINAGE. HEALING WOUND FROM PREVIOUS I&D. TENDERNESS TO PALPATION. LIMITED FLEXION BUT ABLE TO FULLY EXTEND. ALL OTHER DIGITS ARE UNREMARKABLE.  Pulses: 2+ and symmetric  Skin: Skin color, texture, turgor normal, no rashes or lesions     Neurologic: Normal    Assessment/Plan LEFT INDEX FINGER CELLULITIS    - LEFT INDEX FINGER INCISION AND DRAINAGE  R/B/A DISCUSSED WITH PT IN OFFICE.  PT VOICED UNDERSTANDING OF PLAN CONSENT SIGNED DAY OF SURGERY PT SEEN AND EXAMINED PRIOR TO OPERATIVE PROCEDURE/DAY OF SURGERY SITE MARKED. QUESTIONS ANSWERED WILL GO HOME FOLLOWING SURGERY   WE ARE PLANNING SURGERY FOR YOUR UPPER EXTREMITY. THE RISKS AND BENEFITS OF SURGERY INCLUDE BUT NOT LIMITED TO BLEEDING INFECTION, DAMAGE TO NEARBY NERVES ARTERIES TENDONS, FAILURE OF SURGERY TO ACCOMPLISH ITS INTENDED GOALS, PERSISTENT SYMPTOMS AND NEED FOR FURTHER SURGICAL INTERVENTION. WITH THIS IN MIND WE WILL PROCEED. I HAVE DISCUSSED WITH THE PATIENT THE PRE AND POSTOPERATIVE REGIMEN AND THE DOS AND DON'TS. PT VOICED  UNDERSTANDING AND INFORMED CONSENT SIGNED.  Caius Silbernagel Melvyn Novas MD   Karma Greaser 03/08/2020, 12:20 PM

## 2020-03-08 NOTE — Progress Notes (Signed)
Ms Rhonda [name] denies chest pain or shortness. Patient was tested for Covid today and has been at home alone.  Patient has a history of DM, patient is not on medication. "I lost a lot of weight and it is no longer elevated."  Patient does not check CBG.

## 2020-03-08 NOTE — Telephone Encounter (Signed)
I did speak back with patient and she was in the process of doing so.  She will get an appointment in [place].

## 2020-03-09 ENCOUNTER — Ambulatory Visit (HOSPITAL_COMMUNITY): Payer: 59 | Admitting: Anesthesiology

## 2020-03-09 ENCOUNTER — Ambulatory Visit (HOSPITAL_COMMUNITY)
Admission: RE | Admit: 2020-03-09 | Discharge: 2020-03-09 | Disposition: A | Discharge status: Home / Self Care | Payer: 59 | Attending: Orthopedic Surgery | Admitting: Orthopedic Surgery

## 2020-03-09 ENCOUNTER — Encounter (HOSPITAL_COMMUNITY)
Admission: RE | Disposition: A | Discharge status: Home / Self Care | Payer: Self-pay | Source: Home / Self Care | Attending: Orthopedic Surgery

## 2020-03-09 ENCOUNTER — Encounter (HOSPITAL_COMMUNITY): Payer: Self-pay | Admitting: Orthopedic Surgery

## 2020-03-09 DIAGNOSIS — L03012 Cellulitis of left finger: Secondary | ICD-10-CM

## 2020-03-09 DIAGNOSIS — Z8349 Family history of other endocrine, nutritional and metabolic diseases: Secondary | ICD-10-CM | POA: Insufficient documentation

## 2020-03-09 DIAGNOSIS — M869 Osteomyelitis, unspecified: Secondary | ICD-10-CM | POA: Insufficient documentation

## 2020-03-09 DIAGNOSIS — Z8249 Family history of ischemic heart disease and other diseases of the circulatory system: Secondary | ICD-10-CM | POA: Diagnosis not present

## 2020-03-09 DIAGNOSIS — Z809 Family history of malignant neoplasm, unspecified: Secondary | ICD-10-CM | POA: Insufficient documentation

## 2020-03-09 DIAGNOSIS — Z87891 Personal history of nicotine dependence: Secondary | ICD-10-CM | POA: Diagnosis not present

## 2020-03-09 DIAGNOSIS — B951 Streptococcus, group B, as the cause of diseases classified elsewhere: Secondary | ICD-10-CM | POA: Diagnosis not present

## 2020-03-09 DIAGNOSIS — F319 Bipolar disorder, unspecified: Secondary | ICD-10-CM | POA: Insufficient documentation

## 2020-03-09 DIAGNOSIS — Z833 Family history of diabetes mellitus: Secondary | ICD-10-CM | POA: Insufficient documentation

## 2020-03-09 DIAGNOSIS — Z881 Allergy status to other antibiotic agents status: Secondary | ICD-10-CM | POA: Insufficient documentation

## 2020-03-09 DIAGNOSIS — Z880 Allergy status to penicillin: Secondary | ICD-10-CM | POA: Insufficient documentation

## 2020-03-09 DIAGNOSIS — E1169 Type 2 diabetes mellitus with other specified complication: Secondary | ICD-10-CM | POA: Diagnosis not present

## 2020-03-09 DIAGNOSIS — S61211A Laceration without foreign body of left index finger without damage to nail, initial encounter: Secondary | ICD-10-CM | POA: Diagnosis present

## 2020-03-09 HISTORY — PX: INCISION AND DRAINAGE: SHX5863

## 2020-03-09 LAB — BASIC METABOLIC PANEL
Anion gap: 10 (ref 5–15)
BUN: 14 mg/dL (ref 6–20)
CO2: 24 mmol/L (ref 22–32)
Calcium: 9.3 mg/dL (ref 8.9–10.3)
Chloride: 100 mmol/L (ref 98–111)
Creatinine, Ser: 0.56 mg/dL (ref 0.44–1.00)
GFR, Estimated: >60 mL/min (ref >60)
Glucose, Bld: 222 mg/dL — ABNORMAL HIGH (ref 70–99)
Potassium: 4.2 mmol/L (ref 3.5–5.1)
Sodium: 134 mmol/L — ABNORMAL LOW (ref 135–145)

## 2020-03-09 LAB — GLUCOSE, CAPILLARY
Glucose-Capillary: 210 mg/dL — ABNORMAL HIGH (ref 70–99)
Glucose-Capillary: 191 mg/dL — ABNORMAL HIGH (ref 70–99)
Glucose-Capillary: 175 mg/dL — ABNORMAL HIGH (ref 70–99)

## 2020-03-09 SURGERY — INCISION AND DRAINAGE
Anesthesia: Monitor Anesthesia Care | Site: Finger | Laterality: Left

## 2020-03-09 MED ORDER — LIDOCAINE 2% (20 MG/ML) 5 ML SYRINGE
INTRAMUSCULAR | Status: DC | PRN
  Administered 2020-03-09: 50 mg via INTRAVENOUS

## 2020-03-09 MED ORDER — CLINDAMYCIN PHOSPHATE 900 MG/50ML IV SOLN
900.0000 mg | INTRAVENOUS | Status: AC
  Administered 2020-03-09: 900 mg via INTRAVENOUS

## 2020-03-09 MED ORDER — FENTANYL CITRATE (PF) 100 MCG/2ML IJ SOLN
INTRAMUSCULAR | Status: AC
  Administered 2020-03-09: 25 ug via INTRAVENOUS
  Filled 2020-03-09: qty 2

## 2020-03-09 MED ORDER — OXYCODONE HCL 5 MG PO TABS: 5.0000 mg | ORAL_TABLET | Freq: Once | ORAL | Status: AC

## 2020-03-09 MED ORDER — FENTANYL CITRATE (PF) 100 MCG/2ML IJ SOLN
25.0000 ug | INTRAMUSCULAR | Status: DC | PRN
  Administered 2020-03-09: 25 ug via INTRAVENOUS

## 2020-03-09 MED ORDER — SODIUM CHLORIDE 0.9 % IR SOLN
Status: DC | PRN
  Administered 2020-03-09: 1000 mL

## 2020-03-09 MED ORDER — FENTANYL CITRATE (PF) 250 MCG/5ML IJ SOLN
INTRAMUSCULAR | Status: AC
  Filled 2020-03-09: qty 5

## 2020-03-09 MED ORDER — PROPOFOL 10 MG/ML IV BOLUS
INTRAVENOUS | Status: DC | PRN
  Administered 2020-03-09: 200 mg via INTRAVENOUS
  Administered 2020-03-09: 20 mg via INTRAVENOUS

## 2020-03-09 MED ORDER — MIDAZOLAM HCL 2 MG/2ML IJ SOLN
INTRAMUSCULAR | Status: AC
  Filled 2020-03-09: qty 2

## 2020-03-09 MED ORDER — AMISULPRIDE (ANTIEMETIC) 5 MG/2ML IV SOLN: 10.0000 mg | Freq: Once | INTRAVENOUS | Status: DC | PRN

## 2020-03-09 MED ORDER — EPHEDRINE SULFATE-NACL 50-0.9 MG/10ML-% IV SOSY
PREFILLED_SYRINGE | INTRAVENOUS | Status: DC | PRN
  Administered 2020-03-09: 10 mg via INTRAVENOUS

## 2020-03-09 MED ORDER — PROPOFOL 500 MG/50ML IV EMUL
INTRAVENOUS | Status: DC | PRN
  Administered 2020-03-09: 100 ug/kg/min via INTRAVENOUS

## 2020-03-09 MED ORDER — OXYCODONE HCL 5 MG PO TABS
ORAL_TABLET | ORAL | Status: AC
  Administered 2020-03-09: 5 mg via ORAL
  Filled 2020-03-09: qty 1

## 2020-03-09 MED ORDER — BUPIVACAINE HCL (PF) 0.25 % IJ SOLN
INTRAMUSCULAR | Status: AC
  Filled 2020-03-09: qty 30

## 2020-03-09 MED ORDER — FENTANYL CITRATE (PF) 250 MCG/5ML IJ SOLN
INTRAMUSCULAR | Status: DC | PRN
  Administered 2020-03-09: 100 ug via INTRAVENOUS

## 2020-03-09 MED ORDER — ACETAMINOPHEN 500 MG PO TABS: 1000.0000 mg | ORAL_TABLET | Freq: Once | ORAL | Status: DC

## 2020-03-09 MED ORDER — LIDOCAINE HCL (PF) 1 % IJ SOLN
INTRAMUSCULAR | Status: AC
  Filled 2020-03-09: qty 30

## 2020-03-09 MED ORDER — EPHEDRINE 5 MG/ML INJ
INTRAVENOUS | Status: AC
  Filled 2020-03-09: qty 10

## 2020-03-09 MED ORDER — LACTATED RINGERS IV SOLN: INTRAVENOUS | Status: DC

## 2020-03-09 MED ORDER — KETOROLAC TROMETHAMINE 30 MG/ML IJ SOLN: 30.0000 mg | Freq: Once | INTRAMUSCULAR | Status: AC

## 2020-03-09 MED ORDER — KETOROLAC TROMETHAMINE 30 MG/ML IJ SOLN
INTRAMUSCULAR | Status: AC
  Administered 2020-03-09: 30 mg via INTRAVENOUS
  Filled 2020-03-09: qty 1

## 2020-03-09 MED ORDER — CLINDAMYCIN PHOSPHATE 900 MG/50ML IV SOLN
INTRAVENOUS | Status: AC
  Filled 2020-03-09: qty 50

## 2020-03-09 SURGICAL SUPPLY — 65 items
BNDG CMPR 9X4 STRL LF SNTH (GAUZE/BANDAGES/DRESSINGS) ×1
BNDG COHESIVE 1X5 TAN STRL LF (GAUZE/BANDAGES/DRESSINGS) IMPLANT
BNDG COHESIVE 2X5 TAN STRL LF (GAUZE/BANDAGES/DRESSINGS) ×1 IMPLANT
BNDG CONFORM 2 STRL LF (GAUZE/BANDAGES/DRESSINGS) ×1 IMPLANT
BNDG ELASTIC 3X5.8 VLCR STR LF (GAUZE/BANDAGES/DRESSINGS) ×2 IMPLANT
BNDG ELASTIC 4X5.8 VLCR STR LF (GAUZE/BANDAGES/DRESSINGS) ×2 IMPLANT
BNDG ESMARK 4X9 LF (GAUZE/BANDAGES/DRESSINGS) ×2 IMPLANT
BNDG GAUZE ELAST 4 BULKY (GAUZE/BANDAGES/DRESSINGS) ×2 IMPLANT
CNTNR URN SCR LID CUP LEK RST (MISCELLANEOUS) IMPLANT
CONT SPEC 4OZ STRL OR WHT (MISCELLANEOUS) ×2
CORD BIPOLAR FORCEPS 12FT (ELECTRODE) ×2 IMPLANT
COVER SURGICAL LIGHT HANDLE (MISCELLANEOUS) ×2 IMPLANT
COVER WAND RF STERILE (DRAPES) ×2 IMPLANT
CUFF TOURN SGL QUICK 18X4 (TOURNIQUET CUFF) ×2 IMPLANT
CUFF TOURN SGL QUICK 24 (TOURNIQUET CUFF)
CUFF TRNQT CYL 24X4X16.5-23 (TOURNIQUET CUFF) IMPLANT
DRAIN PENROSE 1/4X12 LTX STRL (WOUND CARE) IMPLANT
DRAPE SURG 17X23 STRL (DRAPES) ×2 IMPLANT
DRSG ADAPTIC 3X8 NADH LF (GAUZE/BANDAGES/DRESSINGS) ×2 IMPLANT
DRSG EMULSION OIL 3X3 NADH (GAUZE/BANDAGES/DRESSINGS) ×1 IMPLANT
ELECT REM PT RETURN 9FT ADLT (ELECTROSURGICAL)
ELECTRODE REM PT RTRN 9FT ADLT (ELECTROSURGICAL) IMPLANT
GAUZE PACKING IODOFORM 1/4X15 (PACKING) ×1 IMPLANT
GAUZE SPONGE 4X4 12PLY STRL (GAUZE/BANDAGES/DRESSINGS) ×2 IMPLANT
GAUZE SPONGE 4X4 12PLY STRL LF (GAUZE/BANDAGES/DRESSINGS) ×1 IMPLANT
GAUZE XEROFORM 1X8 LF (GAUZE/BANDAGES/DRESSINGS) ×2 IMPLANT
GAUZE XEROFORM 5X9 LF (GAUZE/BANDAGES/DRESSINGS) IMPLANT
GLOVE BIO SURGEON STRL SZ 6.5 (GLOVE) ×1 IMPLANT
GLOVE BIOGEL PI IND STRL 8.5 (GLOVE) ×1 IMPLANT
GLOVE BIOGEL PI INDICATOR 8.5 (GLOVE) ×1
GLOVE SURG ORTHO 8.0 STRL STRW (GLOVE) ×2 IMPLANT
GOWN STRL REUS W/ TWL LRG LVL3 (GOWN DISPOSABLE) ×3 IMPLANT
GOWN STRL REUS W/ TWL XL LVL3 (GOWN DISPOSABLE) ×1 IMPLANT
GOWN STRL REUS W/TWL LRG LVL3 (GOWN DISPOSABLE) ×6
GOWN STRL REUS W/TWL XL LVL3 (GOWN DISPOSABLE) ×2
HANDPIECE INTERPULSE COAX TIP (DISPOSABLE)
KIT BASIN OR (CUSTOM PROCEDURE TRAY) ×2 IMPLANT
KIT TURNOVER KIT B (KITS) ×2 IMPLANT
MANIFOLD NEPTUNE II (INSTRUMENTS) ×2 IMPLANT
NDL HYPO 25GX1X1/2 BEV (NEEDLE) IMPLANT
NEEDLE HYPO 25GX1X1/2 BEV (NEEDLE) IMPLANT
NS IRRIG 1000ML POUR BTL (IV SOLUTION) ×2 IMPLANT
PACK ORTHO EXTREMITY (CUSTOM PROCEDURE TRAY) ×2 IMPLANT
PAD ARMBOARD 7.5X6 YLW CONV (MISCELLANEOUS) ×4 IMPLANT
PAD CAST 4YDX4 CTTN HI CHSV (CAST SUPPLIES) ×1 IMPLANT
PADDING CAST COTTON 4X4 STRL (CAST SUPPLIES) ×2
SET CYSTO W/LG BORE CLAMP LF (SET/KITS/TRAYS/PACK) IMPLANT
SET HNDPC FAN SPRY TIP SCT (DISPOSABLE) IMPLANT
SOAP 2 % CHG 4 OZ (WOUND CARE) ×2 IMPLANT
SPONGE LAP 18X18 RF (DISPOSABLE) ×2 IMPLANT
SPONGE LAP 4X18 RFD (DISPOSABLE) ×2 IMPLANT
SUT ETHILON 4 0 PS 2 18 (SUTURE) IMPLANT
SUT ETHILON 5 0 P 3 18 (SUTURE)
SUT NYLON ETHILON 5-0 P-3 1X18 (SUTURE) IMPLANT
SUT PROLENE 4 0 RB 1 (SUTURE) ×2
SUT PROLENE 4-0 RB1 .5 CRCL 36 (SUTURE) IMPLANT
SWAB COLLECTION DEVICE MRSA (MISCELLANEOUS) ×3 IMPLANT
SWAB CULTURE ESWAB REG 1ML (MISCELLANEOUS) ×1 IMPLANT
SYR CONTROL 10ML LL (SYRINGE) IMPLANT
TOWEL GREEN STERILE (TOWEL DISPOSABLE) ×2 IMPLANT
TOWEL GREEN STERILE FF (TOWEL DISPOSABLE) ×2 IMPLANT
TUBE CONNECTING 12X1/4 (SUCTIONS) ×2 IMPLANT
UNDERPAD 30X36 HEAVY ABSORB (UNDERPADS AND DIAPERS) ×2 IMPLANT
WATER STERILE IRR 1000ML POUR (IV SOLUTION) ×2 IMPLANT
YANKAUER SUCT BULB TIP NO VENT (SUCTIONS) ×2 IMPLANT

## 2020-03-09 NOTE — Transfer of Care (Signed)
Immediate Anesthesia Transfer of Care Note  Patient: Rhonda [name]  Procedure(s) Performed: INCISION AND DRAINAGE (Left Finger)  Patient Location: PACU  Anesthesia Type:General  Level of Consciousness: drowsy and patient cooperative  Airway & Oxygen Therapy: Patient Spontanous Breathing and Patient connected to nasal cannula oxygen  Post-op Assessment: Report given to RN and Post -op Vital signs reviewed and stable  Post vital signs: Reviewed and stable  Last Vitals:  Vitals Value Taken Time  BP 125/82 03/09/20 1537  Temp    Pulse 109 03/09/20 1537  Resp 17 03/09/20 1537  SpO2 99 % 03/09/20 1537  Vitals shown include unvalidated device data.  Last Pain:  Vitals:   03/09/20 1218  TempSrc:   PainSc: 0-No pain         Complications: No complications documented.

## 2020-03-09 NOTE — Discharge Instructions (Addendum)
KEEP BANDAGE CLEAN AND DRY CALL OFFICE FOR F/U APPT [phone] IN 2 DAYS [facility]  KEEP HAND ELEVATED ABOVE HEART OK TO APPLY ICE TO OPERATIVE AREA CONTACT OFFICE IF ANY WORSENING PAIN OR CONCERNS.   **Prescriptions have been sent to [facility]**

## 2020-03-09 NOTE — Anesthesia Preprocedure Evaluation (Addendum)
Anesthesia Evaluation  Patient identified by MRN, date of birth, ID band Patient awake    Reviewed: Allergy & Precautions, NPO status , Patient's Chart, lab work & pertinent test results  Airway Mallampati: II  TM Distance: >3 FB Neck ROM: Full    Dental  (+) Dental Advisory Given   Pulmonary neg pulmonary ROS,    breath sounds clear to auscultation       Cardiovascular negative cardio ROS   Rhythm:Regular Rate:Normal     Neuro/Psych negative neurological ROS     GI/Hepatic negative GI ROS, Neg liver ROS,   Endo/Other  diabetes, Type 2  Renal/GU negative Renal ROS     Musculoskeletal  (+) Arthritis ,   Abdominal   Peds  Hematology negative hematology ROS (+)   Anesthesia Other Findings   Reproductive/Obstetrics                             Anesthesia Physical Anesthesia Plan  ASA: II  Anesthesia Plan: General   Post-op Pain Management:    Induction:   PONV Risk Score and Plan: 2 and Propofol infusion, Ondansetron and Treatment may vary due to age or medical condition  Airway Management Planned: Natural Airway and Simple Face Mask  Additional Equipment:   Intra-op Plan:   Post-operative Plan:   Informed Consent: I have reviewed the patients History and Physical, chart, labs and discussed the procedure including the risks, benefits and alternatives for the proposed anesthesia with the patient or authorized representative who has indicated his/her understanding and acceptance.       Plan Discussed with: CRNA  Anesthesia Plan Comments:        Anesthesia Quick Evaluation

## 2020-03-09 NOTE — Anesthesia Procedure Notes (Signed)
Procedure Name: LMA Insertion Date/Time: 03/09/2020 2:57 PM Performed by: Shireen Quan, CRNA Pre-anesthesia Checklist: Patient identified, Emergency Drugs available, Suction available and Patient being monitored Patient Re-evaluated:Patient Re-evaluated prior to induction Oxygen Delivery Method: Circle System Utilized Preoxygenation: Pre-oxygenation with 100% oxygen Induction Type: IV induction Ventilation: Mask ventilation without difficulty LMA: LMA inserted LMA Size: 4.0 Number of attempts: 1 Placement Confirmation: positive ETCO2 Tube secured with: Tape Dental Injury: Teeth and Oropharynx as per pre-operative assessment

## 2020-03-09 NOTE — Op Note (Signed)
PREOPERATIVE DIAGNOSIS:LEFT INDEX FINGER INFECTION   POSTOPERATIVE DIAGNOSIS:SAME  ATTENDING SURGEON:DR.Arlet Marter WHO WAS SCRUBBED AND PRESENT FOR THE ENTIRE PROCEDURE  ASSISTANT SURGEON: Lambert Mody, [facility] who scrubbed in necessary for exposure irrigation closure and dressing application in a timely fashion  ANESTHESIA: GENERAL VIA LMA  OPERATIVE PROCEDURE: Left index finger debridement and curettage of distal phalanx, osteomyelitis, 81448 Left index finger flexor sheath incision and drainage, 26020 Left index finger debridement and drainage of complicated abscess, 26011  IMPLANTS: None  JEH:[id number]  RADIOGRAPHIC INTERPRETATION:NONE  SURGICAL INDICATIONS: Patient is a right-hand-dominant female who presented to multiple offices with worsening infection to the left index finger. The patient was seen and evaluated yesterday. We exposed at length yesterday about the urgent need for surgical intervention to decompress the end of the finger for the infection. Patient elected proceed with the operation today. Risk benefits alternatives were discussed in detail with the patient and signed informed consent was obtained.  SURGICAL TECHNIQUE: Patient was palpated find the preoperative holding area marked apart a marker made left index finger and indicate correct operative site. Patient brought back operating placed supine on anesthesia table where the general anesthetic was administered. Patient tolerated well. Well-padded tourniquet placed on the left brachium seal with the appropriate drape. Left upper extremities then prepped and draped normal sterile fashion. A timeout was called the correct site was then fine procedure then begun. Attention then turned to the index finger and mid axial incision was made directly over the ulnar aspect of the index finger. Dissection carried down through the skin and subcutaneous tissue. Gross purulence was encountered. Protection of the neurovascular bundle was  then done volarly along the ulnar aspect. The volar tip of the index finger was then carefully decompressed. Wound cultures were then taken. Bone was also taken forcing the distal phalanx was not in good quality. Bone cultures were sent. Curettage and debridement of the bone was then done of the distal phalanx. The patient appeared to have osteomyelitis of the distal phalanx. The flexor sheath was inspected. Irrigation of the flexor sheath was then carried out. Did not appear to extend down toward the proximal portion of the finger. The wound was then thoroughly irrigated. Excisional debridement of skin subcutaneous tissue was then carried out with sharp scissors and knife and rongeurs. Patient had a lot of devitalized tissue that was removed. After thorough wound irrigation the wound was then packed with sterile packing gauze and loosely reapproximated two 4-0 Prolene sutures. Adaptic dressing sterile compressive bandage then applied. There is good perfusion of the finger following deflation of the tourniquet. The patient was taken recovery room extubated in good condition.  POSTOPERATIVE PLAN: Patient be discharged to home. See him back in the office in 2 days oral antibiotics and pain medicine. Very guarded prognosis about the viability and preservation of the fingertip. The patient may need repeat I&D and potential loss and amputation of the index finger at the level of the distal interphalangeal joint with the underlying osteomyelitis of the distal phalanx. Continue on the oral antibiotics. We will follow her wound cultures closely.

## 2020-03-10 ENCOUNTER — Encounter (HOSPITAL_COMMUNITY): Payer: Self-pay | Admitting: Orthopedic Surgery

## 2020-03-10 NOTE — Anesthesia Postprocedure Evaluation (Signed)
Anesthesia Post Note  Patient: Rhonda [name]  Procedure(s) Performed: INCISION AND DRAINAGE (Left Finger)     Patient location during evaluation: PACU Anesthesia Type: General Level of consciousness: awake and alert Pain management: pain level controlled Vital Signs Assessment: post-procedure vital signs reviewed and stable Respiratory status: spontaneous breathing, nonlabored ventilation, respiratory function stable and patient connected to nasal cannula oxygen Cardiovascular status: blood pressure returned to baseline and stable Postop Assessment: no apparent nausea or vomiting Anesthetic complications: no   No complications documented.  Last Vitals:  Vitals:   03/09/20 1550 03/09/20 1605  BP: 128/81 (!) 142/91  Pulse: (!) 101 99  Resp: 15 15  Temp:  (!) 36.1 C  SpO2: 100% 97%    Last Pain:  Vitals:   03/09/20 1605  TempSrc:   PainSc: 6                  Kennieth Rad

## 2020-03-14 LAB — AEROBIC/ANAEROBIC CULTURE W GRAM STAIN (SURGICAL/DEEP WOUND)

## 2020-03-15 LAB — AEROBIC/ANAEROBIC CULTURE W GRAM STAIN (SURGICAL/DEEP WOUND): Gram Stain: NONE SEEN

## 2020-09-28 ENCOUNTER — Ambulatory Visit (INDEPENDENT_AMBULATORY_CARE_PROVIDER_SITE_OTHER): Payer: 59 | Admitting: Internal Medicine

## 2020-09-28 ENCOUNTER — Other Ambulatory Visit: Payer: Self-pay

## 2020-09-28 ENCOUNTER — Encounter: Payer: Self-pay | Admitting: Internal Medicine

## 2020-09-28 VITALS — BP 143/92 | HR 100 | Temp 98.1°F | Ht 66.0 in | Wt 138.5 lb

## 2020-09-28 DIAGNOSIS — E1165 Type 2 diabetes mellitus with hyperglycemia: Secondary | ICD-10-CM | POA: Diagnosis not present

## 2020-09-28 DIAGNOSIS — Z1211 Encounter for screening for malignant neoplasm of colon: Secondary | ICD-10-CM | POA: Diagnosis not present

## 2020-09-28 DIAGNOSIS — I10 Essential (primary) hypertension: Secondary | ICD-10-CM | POA: Diagnosis not present

## 2020-09-28 DIAGNOSIS — Z0000 Encounter for general adult medical examination without abnormal findings: Secondary | ICD-10-CM

## 2020-09-28 DIAGNOSIS — E119 Type 2 diabetes mellitus without complications: Secondary | ICD-10-CM | POA: Insufficient documentation

## 2020-09-28 LAB — POCT GLYCOSYLATED HEMOGLOBIN (HGB A1C): Hemoglobin A1C: 14 % — AB (ref 4.0–5.6)

## 2020-09-28 LAB — GLUCOSE, CAPILLARY: Glucose-Capillary: 539 mg/dL (ref 70–99)

## 2020-09-28 MED ORDER — SEMAGLUTIDE 3 MG PO TABS: ORAL_TABLET | ORAL | 0 refills | Status: DC

## 2020-09-28 MED ORDER — SEMAGLUTIDE 7 MG PO TABS
ORAL_TABLET | ORAL | 1 refills | Status: DC
Start: 2020-10-28 — End: 2020-11-28

## 2020-09-28 MED ORDER — METFORMIN HCL 500 MG PO TABS: ORAL_TABLET | ORAL | 3 refills | Status: DC

## 2020-09-28 NOTE — Progress Notes (Signed)
Established Patient Office Visit  Subjective:  Patient ID: Rhonda [name], female    DOB: 05-10-1965  Age: 55 y.o. MRN: [id number]  CC:  Chief Complaint  Patient presents with   Follow-up    PATIENT IS NEW TO CLINIC / FOLLOW UP AIC .   Hyperglycemia    HPI Rhonda [name] presents for establishment of care. She has not followed with a PCP in several years.  Social History:  Works as a Production manager.  She is originally from [place] but moved to [place] as a child.  She drinks 1-2 mixed drinks per month.  Denies tobacco use. Denies illicit substance use.   Type 2 DM History: initially diagnosed when she was in her 71s. She reports being morbidly obese at that time. She had been experiencing increased thirst, increased urine volume, and blurry vision at that time. She had been started on metformin and insulin which she reports tolerating well. She made some major lifestyle changes at that time, reporting she lost around 100#. Diabetes was adequately managed on lifestyle changes alone, and she no longer needed medication.  At her visit today, she reports being told that her blood sugar was high during her visit with GYN in March and was recommended to seek out a PCP.   She denies increased thirst, increased urine volume, blurry vision. She does endorse mild neuropathy in her feet. She denies being told she has kidney or retinal damage in the past.   Hx of Bipolar Disorder This was also diagnosed in her 35s. She had been started on an antipsychotic. She reports that she gained a large amount of weight and developed diabetes as a result of this so she eventually quit taking it shortly therafter.  She feels that her mood has been ok. Does not follow with a therapist.  Past Medical History:  Diagnosis Date   Allergy    Arthritis    knee   Bipolar disorder (HCC)    Dr. Noelle Penner in [facility]   Diabetes mellitus    type II -   not on medication   Hip  fracture [facility])    left    Past Surgical History:  Procedure Laterality Date   INCISION AND DRAINAGE Left 03/09/2020   Procedure: INCISION AND DRAINAGE;  Surgeon: Bradly Bienenstock, MD;  Location: [facility] OR;  Service: Orthopedics;  Laterality: Left;  with IV sedation   KNEE ARTHROSCOPY Right     Family History  Problem Relation Age of Onset   Cancer Mother    Diabetes Mother    Diabetes Father    Hyperlipidemia Brother    Hypertension Brother     Social History   Socioeconomic History   Marital status: Divorced    Spouse name: Not on file   Number of children: Not on file   Years of education: Not on file   Highest education level: Not on file  Occupational History   Occupation: Advertising account planner  Tobacco Use   Smoking status: Never   Smokeless tobacco: Never  Vaping Use   Vaping Use: Never used  Substance and Sexual Activity   Alcohol use: No    Comment: every now and then   Drug use: Not Currently    Comment: years ago   Sexual activity: Not on file  Other Topics Concern   Not on file  Social History Narrative   Not on file   Social Determinants of Health   Financial Resource Strain: Not  on file  Food Insecurity: Not on file  Transportation Needs: Not on file  Physical Activity: Not on file  Stress: Not on file  Social Connections: Not on file  Intimate Partner Violence: Not on file    Outpatient Medications Prior to Visit  Medication Sig Dispense Refill   doxycycline (VIBRAMYCIN) 100 MG capsule Take 1 capsule (100 mg total) by mouth 2 (two) times daily. (Patient not taking: Reported on 03/08/2020) 14 capsule 0   ibuprofen (ADVIL) 200 MG tablet Take 400 mg by mouth every 6 (six) hours as needed for mild pain or moderate pain.     Multiple Vitamins-Minerals (CENTRUM) tablet Take 1 tablet by mouth daily.       No facility-administered medications prior to visit.    Allergies  Allergen Reactions   Merthiolate [Benzalkonium Chloride]    Penicillins Other (See  Comments)    Pain / headache Reaction: 10 years ago   Merthiolate [Benzalkonium Chloride] Rash    ROS Review of Systems  Constitutional:  Negative for fatigue, fever and unexpected weight change.  HENT:  Negative for sore throat and trouble swallowing.   Eyes:  Negative for photophobia and visual disturbance.  Respiratory:  Negative for cough and shortness of breath.   Cardiovascular:  Negative for chest pain and leg swelling.  Gastrointestinal:  Negative for abdominal distention and abdominal pain.  Endocrine: Negative for polydipsia and polyuria.  Musculoskeletal:  Negative for gait problem and joint swelling.  Skin:  Negative for color change and rash.  Neurological:  Negative for dizziness and light-headedness.  Psychiatric/Behavioral:  Negative for suicidal ideas. The patient is not nervous/anxious.      Objective:     BP (!) 143/92 (BP Location: Right Arm, Patient Position: Sitting, Cuff Size: Small)   Pulse 100   Temp 98.1 F (36.7 C) (Oral)   Ht 5\' 6"  (1.676 m)   Wt 138 lb 8 oz (62.8 kg)   SpO2 100%   BMI 22.35 kg/m  Wt Readings from Last 3 Encounters:  09/28/20 138 lb 8 oz (62.8 kg)  03/09/20 145 lb (65.8 kg)  03/05/20 139 lb 15.9 oz (63.5 kg)   General: well appearing, in NAD HENT: atraumatic, MMM Eyes: no scleral icterus or conjunctival injection Cardiac: RRR, no LE edema Pulm: breathing comfortably on room air. Lungs clear GI: soft. Not distended. No tenderness Neuro: no focal neurologic deficits. Congnition intact MSK: normal muscle bulk and tone Skin: no rash or lesion on limited exam  Health Maintenance Due  Topic Date Due   OPHTHALMOLOGY EXAM  Never done   URINE MICROALBUMIN  Never done   COLONOSCOPY (Pts 45-37yrs Insurance coverage will need to be confirmed)  Never done     Assessment & Plan:   Problem List Items Addressed This Visit       Cardiovascular and Mediastinum   Hypertension (Chronic)    Blood pressure is above goal in the  office today--143/92. She reports her blood pressures being in the 120s/80s consistently at her gynecologist appointments.  On chart review, it looks like her blood pressures have been more elevated in the past. She has had multiple readings in the 140s-170s systolically.  Assessment: primary hypertension. Discussed long term complications of untreated hypertension. Due to starting the metformin and rybelsus at today's visit, she did not wish to start on additional medications. She declined a lipid panel today, reporting that it was done at her GYN office in March.  Plan: will continue to discuss starting treatment at  future office visits         Endocrine   Uncontrolled type 2 diabetes mellitus (HCC) (Chronic)    A1C is 14 today. Endorses some neuropathy in her feet. denies hyperglycemic symptoms. Tolerated metformin well in the past.  No known contraindications to GLP-1s.  Plan -start metformin at 500mg  daily and titrate up to 1000mg  twice daily -start rybelsus at 3mg  daily x4w, then increase to 7mg  daily thereafter. She plans to start this after tolerating maximum metformin dosing -discussed statin treatment at today's visit. She declines as she does not want to start multiple medications. Will revisit discussion at follow up -referral to ophthalmology placed. May undergo retinopathy screening with if seen first. -referral to for diabetes education and diet management -urine microalbumin/creatinine collected -foot exam performed at today's visit -follow up in 3 months.        Relevant Medications   metFORMIN (GLUCOPHAGE) 500 MG tablet   Semaglutide 3 MG TABS   Semaglutide 7 MG TABS (Start on 10/28/2020)   Other Relevant Orders   POC Hbg A1C (Completed)   Glucose, capillary (Completed)   Microalbumin / Creatinine Urine Ratio   Basic metabolic panel   Ambulatory referral to Ophthalmology   Amb ref to Medical Nutrition Therapy-MNT     Other   Healthcare maintenance  (Chronic)    Reviewed age appropriate health maintenance items with Adisynn. The below comments are based on her report -Cervical Cancer Screening: Last pap March 2022, reported to be normal. [facility]. -Breast cancer screening: reports mammogram being performed yearly through GYN office -Colon cancer screening: never done. No personal or family history of colon cancer. Referred to GI for colonoscopy -Vaccinations: unsure what she has received through GYN office. Will address at next office visit       Other Visit Diagnoses     Screening for colon cancer    -  Primary   Relevant Orders   Ambulatory referral to Gastroenterology       Meds ordered this encounter  Medications   metFORMIN (GLUCOPHAGE) 500 MG tablet    Sig: Start by taking one pill in the morning for one week, followed by 1 pill in the morning and one at night, followed by 2 pills in the morning and one at night, followed by 2 pills in the morning and 2 at night    Dispense:  120 tablet    Refill:  3   Semaglutide 3 MG TABS    Sig: Take one 3mg  tablet daily for 4 weeks, followed by 7mg  daily thereafter    Dispense:  30 tablet    Refill:  0    Prescription #1 of 2   Semaglutide 7 MG TABS    Sig: Take 7mg  by mouth daily    Dispense:  30 tablet    Refill:  1    Prescription 2/2. To be dispensed after completion of 4 weeks on 3mg  dosing.    Follow-up: Return in about 3 months (around 12/29/2020).    08-07-1975, MD Internal Medicine Resident PGY-2 [facility] 09/28/2020 6:30 PM

## 2020-09-28 NOTE — Assessment & Plan Note (Addendum)
Blood pressure is above goal in the office today--143/92. She reports her blood pressures being in the 120s/80s consistently at her gynecologist appointments.  On chart review, it looks like her blood pressures have been more elevated in the past. She has had multiple readings in the 140s-170s systolically.  Assessment: primary hypertension. Discussed long term complications of untreated hypertension. Due to starting the metformin and rybelsus at today's visit, she did not wish to start on additional medications. She declined a lipid panel today, reporting that it was done at her GYN office in March.  Plan: will continue to discuss starting treatment at future office visits

## 2020-09-28 NOTE — Assessment & Plan Note (Addendum)
A1C is 14 today. Endorses some neuropathy in her feet. denies hyperglycemic symptoms. Tolerated metformin well in the past.  No known contraindications to GLP-1s.  Plan -start metformin at 500mg  daily and titrate up to 1000mg  twice daily -start rybelsus at 3mg  daily x4w, then increase to 7mg  daily thereafter. She plans to start this after tolerating maximum metformin dosing -discussed statin treatment at today's visit. She declines as she does not want to start multiple medications. Will revisit discussion at follow up -referral to ophthalmology placed. May undergo retinopathy screening with if seen first. -referral to for diabetes education and diet management -urine microalbumin/creatinine collected -foot exam performed at today's visit -follow up in 3 months.

## 2020-09-28 NOTE — Assessment & Plan Note (Addendum)
Reviewed age appropriate health maintenance items with Moyinoluwa. The below comments are based on her report -Cervical Cancer Screening: Last pap March 2022, reported to be normal. Follows at [facility] on [address]. -Breast cancer screening: reports mammogram being performed yearly through GYN office -Colon cancer screening: never done. No personal or family history of colon cancer. Referred to GI for colonoscopy -Vaccinations: unsure what she has received through GYN office. Will address at next office visit

## 2020-09-29 ENCOUNTER — Telehealth: Payer: Self-pay

## 2020-09-29 LAB — BASIC METABOLIC PANEL
BUN/Creatinine Ratio: 21 (ref 9–23)
BUN: 15 mg/dL (ref 6–24)
CO2: 22 mmol/L (ref 20–29)
Calcium: 9.9 mg/dL (ref 8.7–10.2)
Chloride: 96 mmol/L (ref 96–106)
Creatinine, Ser: 0.73 mg/dL (ref 0.57–1.00)
Glucose: 466 mg/dL — ABNORMAL HIGH (ref 65–99)
Potassium: 4.7 mmol/L (ref 3.5–5.2)
Sodium: 134 mmol/L (ref 134–144)
eGFR: 97 mL/min/{1.73_m2} (ref >59)

## 2020-09-29 LAB — MICROALBUMIN / CREATININE URINE RATIO
Creatinine, Urine: 46.7 mg/dL
Microalb/Creat Ratio: 13 mg/g creat (ref 0–29)
Microalbumin, Urine: 6.2 ug/mL

## 2020-09-29 NOTE — Telephone Encounter (Signed)
We can discuss it at a future visit. She was thinking that she had received it from her OBGYN. If she has not received it, she is more than welcome to schedule a nurse visit.

## 2020-09-29 NOTE — Telephone Encounter (Signed)
Requesting to speak with Dr. Ephriam Knuckles about yesterday visit, also to discuss about medication.

## 2020-09-29 NOTE — Telephone Encounter (Signed)
Return pt's call who stated she was seen yesterday and she lost the AVS. She wanted to know which medications she's on. Informed Metformin 500 mg and Semaglutide 3 and 7 mg. She stated [facility] is out of stock of sem[facility]ide until Friday (tomorrow). She also asked about tetanus shot; stated the doctor had mentioned it but one was not given. According to health maintenance, it's not due until 08/2021 - if pt needs it now,she can schedule a nurse visit with an order from the doctor. Thanks

## 2020-09-29 NOTE — Progress Notes (Signed)
Internal Medicine Clinic Attending  Case discussed with Dr. Christian  At the time of the visit.  We reviewed the resident's history and exam and pertinent patient test results.  I agree with the assessment, diagnosis, and plan of care documented in the resident's note.  

## 2020-09-29 NOTE — Progress Notes (Signed)
Please let pt know that her renal function looked ok on labs. Thank you!

## 2020-10-04 ENCOUNTER — Telehealth: Payer: Self-pay

## 2020-10-04 NOTE — Telephone Encounter (Signed)
Rhonda Radon, MD  09/29/2020  7:37 PM EDT Back to Top     Please let pt know that her renal function looked ok on labs. Thank you!    TC place and self identified VM obtained, Hippa compliant VM/results left SChaplin, RN,BSN

## 2020-10-10 ENCOUNTER — Encounter: Payer: 59 | Admitting: Dietician

## 2020-10-12 ENCOUNTER — Encounter: Payer: Self-pay | Admitting: Dietician

## 2020-10-12 ENCOUNTER — Ambulatory Visit (INDEPENDENT_AMBULATORY_CARE_PROVIDER_SITE_OTHER): Payer: 59 | Admitting: Dietician

## 2020-10-12 DIAGNOSIS — E1165 Type 2 diabetes mellitus with hyperglycemia: Secondary | ICD-10-CM

## 2020-10-12 LAB — GLUCOSE, CAPILLARY: Glucose-Capillary: 145 mg/dL — ABNORMAL HIGH (ref 70–99)

## 2020-10-12 NOTE — Progress Notes (Signed)
  Medical Nutrition Therapy:  Appt start time: 1418 end time:  1505. Total time: 47 Visit # 1  Assessment:  Primary concerns today: meal planning for her diabetes . She wanted a specific meal plan to follow. She says her appetite and intake have not changed in the past 2 weeks after starting medication for her diabetes. She reports a strong family history of diabetes and a brother who lost vision before being diagnosed. She works Tree surgeon and as a Lawyer. She was concerned about her weight today, both her weight gain in the past 2 weeks and her weight loss over the past 22 years. She was offered a sample meter today.   Preferred Learning Style: No preference indicated  Learning Readiness to change: seems ready and change in progress with taking medications  ANTHROPOMETRICS: Estimated body mass index is 22.79 kg/m as calculated from the following:   Height as of 09/28/20: 5\' 6"  (1.676 m).   Weight as of this encounter: 141 lb 3.2 oz (64 kg).  WEIGHT HISTORY: highest 1997- 242#, but was 125-130# in high school ~ 1984, diagnosed with diabetes in 2000  SLEEP:need to assess at future visit  MEDICATIONS: rybelsus 7 mg daily and metformin   BLOOD SUGAR:145 today in office before lunch  DIETARY INTAKE: Usual eating pattern includes 3 meals and unsure about how many snacks per day. Everyday foods include says she eats fast food a lot, hamburgers, [employer].  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   Nutritional  Diagnosis:  NB-1.1 Food and nutrition-related knowledge deficit As related to lack of prior adequate education.  As evidenced by her report/24 hour recall and questions.    Intervention:  Nutrition education about diabetes and diabetes meal planning. Action Goal: modify carb intake  Outcome goal: improved confidences and knowledge about diabetes meal planning Coordination of care:   Teaching Method Utilized: Visual, Auditory,Hands on Handouts given during visit include: After visit summary, carb counting and meal planning for diabetes booklet Barriers to learning/adherence to lifestyle change: competing values Demonstrated degree of understanding via:  Teach Back   Monitoring/Evaluation:  Dietary intake, exercise, and body weight in 4 week(s) [place], RD 10/12/2020 4:22 PM. .

## 2020-10-12 NOTE — Patient Instructions (Addendum)
Thank you for your visit today!  Goals to work on:   Creating a meal plan that works for you:   Each meal should contain:  about 45-50 grams carb total & 25 grams protein and 5 or more grams fiber Each snack should contain: about 10-29 grams carb, 5-10 grams protein, 3-5 grams fiber  Add healthy fats and snacks to meet your needs as needed.   If you are going to be very active after a meal you may be able to eat more carbohydrates before that activity to give you the fuel you need to perform it.   Breakfast-  25 grams protein= 1-2 egg or nuts or Greek yogurt 2 starch= 1 cup oatmeal (30 grams carb) or 2 slice whole wheat, rye, pumpernickel or sourdough bread 1 fruit- 1 cup strawberries (15 grams carb) Fat- nuts, margarine,egg have fat, [place] bacon  Water or if you want 1 cup milk suggest decreasing oatmeal to 1/2 cup  Lunch:  Protein- 3 oz chicken or fish, beans, nuts or a combination  2-4 Vegetables- 1-2 cups or more non starchy veggies (10-15 grams carb) 1  Starch- 1 serving of bread or small whole wheat wraps, grain(quinoa, brown rice), starchy vegetable ( ~ 15 grams carb)  Fat- Oil, salad dressing, nuts and seeds like sunflower seeds  1 Dairy- 1 cup yogurt or milk ( 12-15 grams carb)  Beverage- water, tea with 1-2 teaspoons of sugar or milk as above  Dinner:  Protein- 3 oz chicken or fish, beans, nuts or a combination  2-4 Vegetables- 1-2 cups or more non starchy veggies (10-15 grams carb) 1  Starch- 1 serving of bread or small whole wheat wraps, grain(quinoa, brown rice), starchy vegetable ( ~ 15 grams carb)  Fat- Oil, salad dressing, nuts and seeds like sunflower seeds  1 Dairy- 1 cup yogurt or milk ( 12-15 grams carb)  Beverage- water, tea with 1-2 teaspoons of sugar or milk as above  Snacks- whole fruit, whole grain, nuts, seeds, yogurt  Eat more... 1. Whole grains: whole wheat cereal, crackers, bread, pasta, old fashioned oats & brown rice 2. Whole fruits-1 cup a  day 3. Vegetables- 2-3 cups a day 4. Lowfat Protein: Chicken, [place], lean cuts of beef and pork, fish 2-3 times a week 5. Nuts, Seeds and Soy:add walnuts to cereal, peanut butter sandwich  Eat Less... Refined carb foods (white bread, white rice, some cereals, cake, candy) Sugar-sweetened drinks ?Red meat  Processed meats (bacon, sausage, cold cuts) Margarine, shortening, lard Ultra-processed foods (packaged snacks, poultry nuggets, instant noodles, candy, additives)    Please feel free to call me anytime.  [facility] [phone]

## 2020-11-09 ENCOUNTER — Ambulatory Visit (INDEPENDENT_AMBULATORY_CARE_PROVIDER_SITE_OTHER): Payer: 59 | Admitting: Internal Medicine

## 2020-11-09 ENCOUNTER — Encounter: Payer: Self-pay | Admitting: Internal Medicine

## 2020-11-09 DIAGNOSIS — H60331 Swimmer's ear, right ear: Secondary | ICD-10-CM

## 2020-11-09 DIAGNOSIS — I10 Essential (primary) hypertension: Secondary | ICD-10-CM

## 2020-11-09 DIAGNOSIS — H6090 Unspecified otitis externa, unspecified ear: Secondary | ICD-10-CM | POA: Insufficient documentation

## 2020-11-09 MED ORDER — CIPROFLOXACIN-DEXAMETHASONE 0.3-0.1 % OT SUSP: 4.0000 [drp] | Freq: Two times a day (BID) | OTIC | 0 refills | Status: AC

## 2020-11-09 NOTE — Assessment & Plan Note (Signed)
History of elevated blood pressure with systolic readings 140-170.  BP 145/87 during visit today. Patient only wished to be treated for ear pain today. Continually elevated BP will need to be addressed at next visit.

## 2020-11-09 NOTE — Progress Notes (Addendum)
   CC: swimmer's ear  HPI:[name] is a 55 y.o. female who presents for evaluation of swimmer's ear. Please see individual problem based A/P for details.  Please see encounters tab for problem based charting.  Depression, PHQ-9: Based on the patients  score we have .  Past Medical History:  Diagnosis Date   Allergy    Arthritis    knee   Bipolar disorder (HCC)    Dr. Noelle Penner in [facility]   Diabetes mellitus    type II -   not on medication   Hip fracture (HCC)    left   Review of Systems:   Review of Systems  Constitutional:  Negative for chills and fever.  HENT:  Positive for ear pain and hearing loss. Negative for ear discharge, sinus pain and sore throat.   Eyes: Negative.   Respiratory: Negative.    Cardiovascular: Negative.   Gastrointestinal: Negative.   Skin:  Negative for itching.  Neurological: Negative.   Endo/Heme/Allergies: Negative.     Physical Exam: Vitals:   11/09/20 1010 11/09/20 1051  BP: (!) 145/87 140/86  Pulse: 99 (!) 101  Temp: 98.1 F (36.7 C)   TempSrc: Oral   SpO2: 99%   Weight: 138 lb 9.6 oz (62.9 kg)      General: alert and oriented. No acute distress HEENT: Mild edema and erythema of right ear canal. Tympanic membrane intact. No periauricular tenderness, no fluctuance or cellulitis noted. No lymphadenopathy. Hearing intact. Cardiovascular: Normal rate, regular rhythm.  No murmurs, rubs, or gallops Pulmonary : Equal breath sounds, No wheezes, rales, or rhonchi Abdominal: soft, nontender,  bowel sounds present Ext: No edema in lower extremities, no tenderness to palpation of lower extremities.   Assessment & Plan:   See Encounters Tab for problem based charting.  Patient seen with Dr. Criselda Peaches

## 2020-11-09 NOTE — Assessment & Plan Note (Addendum)
Mild edema and erythema of canal. Tympanic membrane intact. No periauricular tenderness, no fluctuance, or cellulitis noted. No lymphadenopathy noted.  Mild disease.  - Cipro-dexa drops ordered. Apply 4 drops to affected ear twice daily for 7 days.  - advised to schedule follow up visit if no improvement in symptoms.

## 2020-11-09 NOTE — Patient Instructions (Addendum)
Dear Rhonda [name],  Thank you for trusting me with your care.  Today we addressed the following issues:  Ear pain / Otitis externa  - We noticed some mild redness and swelling. - We prescribed you a medication called Ciprodex. Apply 4 drops to your right ear twice daily for 7 days. - Please call our office if your symptoms do not improve or worsen.

## 2020-11-16 ENCOUNTER — Telehealth: Payer: Self-pay | Admitting: Internal Medicine

## 2020-11-16 NOTE — Telephone Encounter (Signed)
The patient has some questions about the following medication and would like a call back.   ciprofloxacin-dexamethasone (CIPRODEX) OTIC suspension

## 2020-11-16 NOTE — Telephone Encounter (Signed)
Patient informed of MD recommendations, understanding verbalized SChaplin, RN,BSN

## 2020-11-16 NOTE — Telephone Encounter (Signed)
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

## 2020-11-16 NOTE — Telephone Encounter (Signed)
Return call to patient, she states she started using the ABX ear drops and "it feels like I now have something in my ear, like there is wax building up in my ear".  Per LOV note, ear canal had mild redness and swelling.  Pt states MD told her that she had no wax in her ears at office visit.  RN informed patient if she feels something is in her ear now, she can return to clinic for evaluation.  Pt now states "it is not that bad".  RN advised patient to complete the 7 day course of ABX drops as prescribed and if symptoms do not clear up or return after completing course of drops to make a f/u appt.  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  Wants to know if this will be ok or should she avoid this? Will forward to yellow team to advise. SChaplin, RN,BSN

## 2020-11-21 NOTE — Progress Notes (Signed)
Internal Medicine Clinic Attending  I saw and evaluated the patient.  I personally confirmed the key portions of the history and exam documented by Dr. Gawaluck and I reviewed pertinent patient test results.  The assessment, diagnosis, and plan were formulated together and I agree with the documentation in the resident's note.  

## 2020-11-28 ENCOUNTER — Ambulatory Visit (INDEPENDENT_AMBULATORY_CARE_PROVIDER_SITE_OTHER): Payer: 59 | Admitting: Internal Medicine

## 2020-11-28 ENCOUNTER — Other Ambulatory Visit: Payer: Self-pay

## 2020-11-28 ENCOUNTER — Encounter: Payer: Self-pay | Admitting: Internal Medicine

## 2020-11-28 VITALS — BP 128/85 | HR 94 | Temp 99.2°F | Resp 28 | Ht 66.0 in | Wt 141.3 lb

## 2020-11-28 DIAGNOSIS — H60331 Swimmer's ear, right ear: Secondary | ICD-10-CM | POA: Diagnosis not present

## 2020-11-28 DIAGNOSIS — E1165 Type 2 diabetes mellitus with hyperglycemia: Secondary | ICD-10-CM | POA: Diagnosis not present

## 2020-11-28 MED ORDER — SEMAGLUTIDE 7 MG PO TABS: ORAL_TABLET | ORAL | 1 refills | Status: DC

## 2020-11-28 NOTE — Assessment & Plan Note (Addendum)
Tolerating metformin well, reports occasional nausea and constipation. Most days forgets to take evening metformin dose. No issues tolerating or accessing rybelsus 225m, refills sent today. Met with DButch Pennyabout diet on 7/13. [facility] yet been [facility] for an appointment. She states she will call them about an appointment. Urine microalbumin/creatinine ration wnl at last visit. She reports prior polyuria and polydipsia have resolved. Denies episodes of hypoglycemia.   Plan:  -Continue Metformin 10071mBID and semaglutide 25m32mA1c at next visit  -Follow-up in 1 month

## 2020-11-28 NOTE — Progress Notes (Addendum)
     HPI:  [name] is a 55 y.o. female with a history of diabetes and bipolar disorder who presents for follow-up on otitis externa and diabetes.   Please see Encounter tab for problem-based assessment and plan.   Review of Systems  Constitutional: Negative.   HENT:  Negative for ear discharge, ear pain (Positive for clogged sensation.) and hearing loss.   Eyes:  Negative for blurred vision.  Respiratory: Negative.    Cardiovascular: Negative.   Gastrointestinal:  Positive for constipation and nausea. Negative for abdominal pain and diarrhea.  Genitourinary: Negative.   Musculoskeletal: Negative.   Skin: Negative.     Physical Exam:  Vitals:   11/28/20 1511 11/28/20 1519  BP: (!) 144/88 128/85  Pulse: 94 94  Resp: (!) 28   Temp: 99.2 F (37.3 C)   TempSrc: Oral   SpO2: 100%   Weight: 141 lb 4.8 oz (64.1 kg)   Height: 5\' 6"  (1.676 m)    Physical Exam Vitals reviewed.  Constitutional:      Appearance: Normal appearance. She is normal weight.  HENT:     Right Ear: Tympanic membrane and external ear normal. No tenderness. There is impacted cerumen.     Ears:     Comments: Mildly erythematous canal. Pulmonary:     Effort: Pulmonary effort is normal.  Musculoskeletal:        General: Normal range of motion.     Cervical back: Normal range of motion.  Skin:    General: Skin is warm and dry.  Neurological:     General: No focal deficit present.     Mental Status: She is alert and oriented to person, place, and time.     Assessment & Plan:   See Encounters Tab for problem based charting.  Patient discussed with Dr. for Student Documentation:  I personally was present and performed or re-performed the history, physical exam and medical decision-making activities of this service and have verified that the service and findings are accurately documented in the student's note.  Gardiner Fanti, MD 11/29/2020, 4:54 PM

## 2020-11-28 NOTE — Patient Instructions (Addendum)
It was a pleasure meeting you! Today we discussed your right-sided swimmer's ear and your diabetes.   Swimmer's ear: I am glad to hear this is no longer painful. To help clean out earwax from the canal, you can place 5-10 drops of hydrogen peroxide twice a day for 4 days. Also consider wearing ear plugs while you're swimming to prevent future occurrence of swimmer's ear.   Diabetes - We will send in refills for your Rybelsus today.   Follow up: 1 month  Should you have any questions or concerns please call the internal medicine clinic at [phone].    Thurmon Fair, M.D.  Dayna Barker, Medical Student  [facility]

## 2020-11-28 NOTE — Assessment & Plan Note (Signed)
Right ear canal mildly erythematous with impacted cerumen. She states pain has improved from cipro-dexa drops. She reports feeling a clogged sensation, likely due to the cerumen present. We discussed that she can try OTC hydrogen peroxide, 5-10 drops BID for 4 days as a cerumenolytic. 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   Follow-up as needed.

## 2020-11-29 ENCOUNTER — Encounter: Payer: Self-pay | Admitting: Internal Medicine

## 2020-11-29 LAB — GLUCOSE, CAPILLARY: Glucose-Capillary: 191 mg/dL — ABNORMAL HIGH (ref 70–99)

## 2020-12-01 ENCOUNTER — Encounter: Payer: Self-pay | Admitting: Internal Medicine

## 2020-12-22 ENCOUNTER — Telehealth: Payer: Self-pay

## 2020-12-22 NOTE — Telephone Encounter (Signed)
Thank you :)

## 2020-12-22 NOTE — Telephone Encounter (Signed)
Requesting to speak with a nurse about meds.  

## 2020-12-22 NOTE — Telephone Encounter (Signed)
Patient called back. States she was prescribed metformin and Rybelsus a few months ago and over last week she has developed a rash on face, arms, and wrists. She read that this can be a rare side effect of Rybelsus so she stopped taking it today. Only other symptom is a "tightening in my abdominal area." Mentions that she knows she has to have a procedure with OB/GYN and wonders if tightening is r/t that. Patient has appt with PCP on 9/26 and she will discuss all this with her at that time.

## 2020-12-22 NOTE — Telephone Encounter (Signed)
Return pt's call - no answer; left message on self-identified vm to call the office for her questions about meds.

## 2020-12-24 NOTE — Progress Notes (Addendum)
Office Visit   Patient ID: Rhonda [name], female    DOB: June 24, 1965, 55 y.o.   MRN: [id number]   PCP: Elige Radon, MD   Subjective:  Rhonda [name] is a 55 y.o. year old female who presents for follow up of chronic medical conditions including hypertension and diabetes. Please refer to problem based charting for assessment and plan.   ACTIVE MEDICATIONS   Outpatient Medications Prior to Visit  Medication Sig   metFORMIN (GLUCOPHAGE) 500 MG tablet Start by taking one pill in the morning for one week, followed by 1 pill in the morning and one at night, followed by 2 pills in the morning and one at night, followed by 2 pills in the morning and 2 at night   Semaglutide 7 MG TABS Take 7mg  by mouth daily   No facility-administered medications prior to visit.     Objective:   BP 135/77 (BP Location: Left Arm, Patient Position: Sitting, Cuff Size: Normal)   Pulse (!) 101   Temp 98 F (36.7 C) (Oral)   Ht 5\' 6"  (1.676 m)   Wt 138 lb 12.8 oz (63 kg)   SpO2 100%   BMI 22.40 kg/m  Wt Readings from Last 3 Encounters:  12/26/20 138 lb 12.8 oz (63 kg)  11/28/20 141 lb 4.8 oz (64.1 kg)  11/09/20 138 lb 9.6 oz (62.9 kg)    BP Readings from Last 3 Encounters:  12/26/20 135/77  11/28/20 128/85  11/09/20 140/86   Gen: well appearing, no distress Skin: maculopapular rash with excoriations present on the bilateral upper arms and face. No open wounds or lesions Neuro: a/o x3 Pulm: breathing comfortably on room air  Health Maintenance:   Health Maintenance  Topic Date Due   COVID-19 Vaccine (1) Never done   OPHTHALMOLOGY EXAM  Never done   PAP SMEAR-Modifier  Never done   COLONOSCOPY (Pts 45-72yrs Insurance coverage will need to be confirmed)  Never done   Zoster Vaccines- Shingrix (1 of 2) 12/29/2020 (Originally 01/10/2015)   MAMMOGRAM  09/28/2021 (Originally 01/10/2015)   TETANUS/TDAP  09/28/2021 (Originally 01/10/1984)   Hepatitis C Screening  09/28/2021 (Originally  01/10/1983)   HIV Screening  09/28/2021 (Originally 01/10/1980)   HEMOGLOBIN A1C  06/25/2021   FOOT EXAM  09/28/2021   URINE MICROALBUMIN  09/28/2021   INFLUENZA VACCINE  Completed   HPV VACCINES  Aged Out    Assessment & Plan:   Problem List Items Addressed This Visit       Endocrine   Type 2 diabetes mellitus (HCC) - Primary    Current medications: metformin 1000mg  BID, rybelsus 7mg  daily A1C is significantly improved from 14>7.5. She reports adherence to the above medications with the exception of this last week when she had developed a rash which she initially attributed to one of these, however this does not seem to be the case.  Foot exam performed 08/2020 Due for retinal screening Plan -Recommended she resume current management.  -Encouraged retinopathy screening -Follow up January 2023. Can consider increasing rybelsus dosing to 14mg  if A1C remains >7 at time of follow up      Relevant Orders   POC Hbg A1C (Completed)   Glucose, capillary (Completed)     Musculoskeletal and Integument   Contact dermatitis    ~7d hx of a rash on bilateral upper extremities that started shortly after a kid vomited in the pool at the [employer] [facility]. Rash is located on her bilateral upper arms, one of  her legs and on her face. She attributed the rash to the metformin and/or rybelsus because she read on the internet that it can happen. She denies fevers, chills, body aches or other sign of systemic infection. Denies any new detergents, lotions or perfumes.  I provided reassurance that the rash is very unlikely to be related to her diabetes medications. Most likely is contact dermatitis which, given the timeline with the kid vomiting in the pool, is most likely from that. Low suspicion for infection although, given her close contact with kids, can not be ruled out. Plan Recommended OTC cortisone cream for a few days If symptoms worsen or do not improve over the next week, she was encouraged to  arrange follow up in our office.  She was encouraged to resume her diabetic therapy        Other   Healthcare maintenance (Chronic)    Flu vaccination provided today.        Return in about 3 months (around 04/07/2021) for Diabetes follow up.   Pt discussed with Dr. Leo Rod, MD Internal Medicine Resident PGY-3 [facility] 12/27/2020 2:59 PM

## 2020-12-26 ENCOUNTER — Encounter: Payer: Self-pay | Admitting: Internal Medicine

## 2020-12-26 ENCOUNTER — Other Ambulatory Visit: Payer: Self-pay

## 2020-12-26 ENCOUNTER — Ambulatory Visit (INDEPENDENT_AMBULATORY_CARE_PROVIDER_SITE_OTHER): Payer: 59 | Admitting: Internal Medicine

## 2020-12-26 VITALS — BP 135/77 | HR 101 | Temp 98.0°F | Ht 66.0 in | Wt 138.8 lb

## 2020-12-26 DIAGNOSIS — I10 Essential (primary) hypertension: Secondary | ICD-10-CM

## 2020-12-26 DIAGNOSIS — E1165 Type 2 diabetes mellitus with hyperglycemia: Secondary | ICD-10-CM

## 2020-12-26 DIAGNOSIS — Z0000 Encounter for general adult medical examination without abnormal findings: Secondary | ICD-10-CM | POA: Diagnosis not present

## 2020-12-26 DIAGNOSIS — L259 Unspecified contact dermatitis, unspecified cause: Secondary | ICD-10-CM | POA: Diagnosis not present

## 2020-12-26 DIAGNOSIS — E119 Type 2 diabetes mellitus without complications: Secondary | ICD-10-CM | POA: Diagnosis not present

## 2020-12-26 LAB — POCT GLYCOSYLATED HEMOGLOBIN (HGB A1C): Hemoglobin A1C: 7.5 % — AB (ref 4.0–5.6)

## 2020-12-26 LAB — GLUCOSE, CAPILLARY: Glucose-Capillary: 129 mg/dL — ABNORMAL HIGH (ref 70–99)

## 2020-12-26 NOTE — Patient Instructions (Signed)
[name], it was a pleasure seeing you today!  Today we discussed:  Diabetes: your A1C is down from 14 to 7.5! This is wonderful! Continue taking rybelsus and metformin   Follow-up: 4 months   Please make sure to arrive 15 minutes prior to your next appointment. If you arrive late, you may be asked to reschedule.   We look forward to seeing you next time. Please call our clinic at [phone] if you have any questions or concerns. The best time to call is Monday-Friday from 9am-4pm, but there is someone available 24/7. If after hours or the weekend, call the main hospital number and ask for the Internal Medicine Resident On-Call. If you need medication refills, please notify your pharmacy one week in advance and they will send [place] a request.  Thank you for letting us take part in your care. Wishing you the best!

## 2020-12-26 NOTE — Assessment & Plan Note (Signed)
Blood pressure is reasonable today. Will continue to monitor

## 2020-12-26 NOTE — Assessment & Plan Note (Signed)
Flu vaccination provided today

## 2020-12-26 NOTE — Assessment & Plan Note (Addendum)
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 Rash is located on her bilateral upper arms, one of her legs and on her face. Rhonda [name] attributed the rash to the metformin and/or rybelsus because Rhonda [name] read on the internet that it can happen. Rhonda [name] denies fevers, chills, body aches or other sign of systemic infection. Denies any new detergents, lotions or perfumes.  I provided reassurance that the rash is very unlikely to be related to her diabetes medications. Most likely is contact dermatitis which, given the timeline with the kid vomiting in the pool, is most likely from that. Low suspicion for infection although, given her close contact with kids, can not be ruled out. Plan  Recommended OTC cortisone cream for a few days  If symptoms worsen or do not improve over the next week, Rhonda [name] was encouraged to arrange follow up in our office.   Rhonda [name] was encouraged to resume her diabetic therapy

## 2020-12-26 NOTE — Assessment & Plan Note (Addendum)
Current medications: metformin 1000mg  BID, rybelsus 7mg  daily A1C is significantly improved from 14>7.5. She reports adherence to the above medications with the exception of this last week when she had developed a rash which she initially attributed to one of these, however this does not seem to be the case.  Foot exam performed 08/2020 Due for retinal screening Plan -Recommended she resume current management.  -Encouraged retinopathy screening -Follow up January 2023. Can consider increasing rybelsus dosing to 14mg  if A1C remains >7 at time of follow up

## 2020-12-27 ENCOUNTER — Encounter: Payer: Self-pay | Admitting: Internal Medicine

## 2020-12-27 MED ORDER — METFORMIN HCL 500 MG PO TABS: 1000.0000 mg | ORAL_TABLET | Freq: Two times a day (BID) | ORAL | 3 refills | Status: DC

## 2020-12-27 MED ORDER — SEMAGLUTIDE 7 MG PO TABS
ORAL_TABLET | ORAL | 1 refills | Status: AC
Start: 2020-12-27 — End: ?

## 2020-12-27 NOTE — Progress Notes (Signed)
Internal Medicine Clinic Attending  Case discussed with Dr. Christian  At the time of the visit.  We reviewed the resident's history and exam and pertinent patient test results.  I agree with the assessment, diagnosis, and plan of care documented in the resident's note.  

## 2021-01-30 ENCOUNTER — Telehealth: Payer: Self-pay

## 2021-01-30 NOTE — Telephone Encounter (Signed)
Requesting proof of flu vaccination. Please call pt back.

## 2021-01-30 NOTE — Telephone Encounter (Signed)
Letter written stating patient received flu vaccine on 12/26/20. Attempted to relay this to patient. Left detailed message on patient's self-identified VM that letter has been written, and should be available on MyChart. Or, please return call to let us know if she would like to pick up the letter, have it mailed, or faxed.

## 2021-02-07 ENCOUNTER — Encounter: Payer: Self-pay | Admitting: Internal Medicine

## 2021-07-14 ENCOUNTER — Other Ambulatory Visit: Payer: Self-pay

## 2021-07-14 DIAGNOSIS — E1165 Type 2 diabetes mellitus with hyperglycemia: Secondary | ICD-10-CM

## 2021-07-14 NOTE — Telephone Encounter (Signed)
metFORMIN (GLUCOPHAGE) 500 MG tablet, REFILL REQUEST @ [place], [place]. ?

## 2021-07-14 NOTE — Telephone Encounter (Signed)
metFORMIN (GLUCOPHAGE) 500 MG tablet ? ? ?Per pt in the process of updating insurance pt is requesting rx to be sent to a pharmacy with a $5 list for med. ?Please call pt back. ? ?

## 2021-07-15 MED ORDER — METFORMIN HCL 500 MG PO TABS: 1000.0000 mg | ORAL_TABLET | Freq: Two times a day (BID) | ORAL | 3 refills | Status: AC

## 2021-09-25 ENCOUNTER — Encounter: Payer: Self-pay | Admitting: *Deleted

## 2021-12-18 ENCOUNTER — Other Ambulatory Visit: Payer: Self-pay | Admitting: Student

## 2021-12-18 ENCOUNTER — Other Ambulatory Visit: Payer: Self-pay | Admitting: Internal Medicine

## 2021-12-18 DIAGNOSIS — Z1231 Encounter for screening mammogram for malignant neoplasm of breast: Secondary | ICD-10-CM

## 2021-12-22 ENCOUNTER — Inpatient Hospital Stay: Admission: RE | Admit: 2021-12-22 | Payer: Self-pay | Source: Ambulatory Visit

## 2022-06-05 ENCOUNTER — Telehealth: Payer: Self-pay | Admitting: Dietician

## 2022-06-05 NOTE — Telephone Encounter (Signed)
Calling to see if patient would like to continue care at our office. Her phone would not allow a message.

## 2022-09-09 ENCOUNTER — Other Ambulatory Visit: Payer: Self-pay | Admitting: Internal Medicine

## 2022-09-09 DIAGNOSIS — E1165 Type 2 diabetes mellitus with hyperglycemia: Secondary | ICD-10-CM

## 2022-09-17 ENCOUNTER — Other Ambulatory Visit: Payer: Self-pay | Admitting: Internal Medicine

## 2022-09-17 DIAGNOSIS — E1165 Type 2 diabetes mellitus with hyperglycemia: Secondary | ICD-10-CM

## 2022-09-18 NOTE — Telephone Encounter (Signed)
Pt was called to schedule an appt if she's still a pt here at [facility]. No answer Message left.
